# Patient Record
Sex: Female | Born: 1955 | Race: White | Hispanic: No | Marital: Married | Smoking: Never smoker
Health system: Southern US, Community
[De-identification: ages and names within clinical notes are randomized; demographics above are authoritative.]

## PROBLEM LIST (undated history)

## (undated) DIAGNOSIS — C801 Malignant (primary) neoplasm, unspecified: Secondary | ICD-10-CM

## (undated) DIAGNOSIS — M5416 Radiculopathy, lumbar region: Secondary | ICD-10-CM

## (undated) DIAGNOSIS — M25519 Pain in unspecified shoulder: Secondary | ICD-10-CM

## (undated) DIAGNOSIS — M549 Dorsalgia, unspecified: Secondary | ICD-10-CM

## (undated) HISTORY — DX: Pain in unspecified shoulder: M25.519

## (undated) HISTORY — PX: ROTATOR CUFF REPAIR: SHX139

## (undated) HISTORY — DX: Dorsalgia, unspecified: M54.9

## (undated) HISTORY — PX: DILATION AND CURETTAGE OF UTERUS: SHX78

## (undated) HISTORY — PX: TONSILLECTOMY AND ADENOIDECTOMY: SHX28

---

## 2019-01-21 ENCOUNTER — Other Ambulatory Visit: Payer: Self-pay | Admitting: *Deleted

## 2019-02-09 ENCOUNTER — Encounter: Payer: Self-pay | Admitting: Vascular Surgery

## 2019-03-14 ENCOUNTER — Telehealth (HOSPITAL_COMMUNITY): Payer: Self-pay | Admitting: Rehabilitation

## 2019-03-14 NOTE — Telephone Encounter (Signed)
The above patient or their representative was contacted and gave the following answers to these questions:         Do you have any of the following symptoms? No  Fever                    Cough                   Shortness of breath  Do  you have any of the following other symptoms? No   muscle pain         vomiting,        diarrhea        rash         weakness        red eye        abdominal pain         bruising          bruising or bleeding              joint pain           severe headache    Have you been in contact with someone who was or has been sick in the past 2 weeks? No  Yes                 Unsure                         Unable to assess   Does the person that you were in contact with have any of the following symptoms?   Cough         shortness of breath           muscle pain         vomiting,            diarrhea            rash            weakness           fever            red eye           abdominal pain           bruising  or  bleeding                joint pain                severe headache               Have you  or someone you have been in contact with traveled internationally in th last month? No        If yes, which countries?   Have you  or someone you have been in contact with traveled outside [place] in th last month? No         If yes, which state and city?   COMMENTS OR ACTION PLAN FOR THIS PATIENT:          

## 2019-03-15 ENCOUNTER — Ambulatory Visit (INDEPENDENT_AMBULATORY_CARE_PROVIDER_SITE_OTHER): Payer: BC Managed Care – PPO | Admitting: Vascular Surgery

## 2019-03-15 ENCOUNTER — Other Ambulatory Visit: Payer: Self-pay

## 2019-03-15 ENCOUNTER — Encounter: Payer: Self-pay | Admitting: Vascular Surgery

## 2019-03-15 VITALS — BP 138/91 | HR 68 | Temp 97.9°F | Resp 18 | Ht 62.0 in | Wt 135.0 lb

## 2019-03-15 DIAGNOSIS — M5137 Other intervertebral disc degeneration, lumbosacral region: Secondary | ICD-10-CM

## 2019-03-15 NOTE — Progress Notes (Signed)
Vascular and Vein Specialist of [facility]  Patient name: Jill Cameron MRN: [id number] DOB: 09-23-1956 Sex: female  REASON FOR CONSULT: Discuss anterior exposure for L5-S1 lumbar fusion with Dr. Lynann Bologna  HPI: Jill Cameron is a 63 y.o. female, who is here today for discussion of anterior exposure.  She has a progressive history of low back pain and also now with weakness in her right leg.  This has been progressive.  She is failed conservative treatment and saw Dr. Lynann Bologna for further evaluation.  He is recommended L5-S1 interbody fusion from anterior and posterior approach.  She is here today for discussion and my role for exposure.  She has never had any intra-abdominal surgery.  Is no history of cardiac disease and no history of peripheral vascular occlusive disease.  Past Medical History:  Diagnosis Date  . Back pain    chronic  . Shoulder pain    right    History reviewed. No pertinent family history.  SOCIAL HISTORY: Social History   Socioeconomic History  . Marital status: Unknown    Spouse name: Not on file  . Number of children: Not on file  . Years of education: Not on file  . Highest education level: Not on file  Occupational History  . Not on file  Social Needs  . Financial resource strain: Not on file  . Food insecurity:    Worry: Not on file    Inability: Not on file  . Transportation needs:    Medical: Not on file    Non-medical: Not on file  Tobacco Use  . Smoking status: Never Smoker  . Smokeless tobacco: Never Used  Substance and Sexual Activity  . Alcohol use: Yes    Alcohol/week: 1.0 standard drinks    Types: 1 Glasses of wine per week  . Drug use: Never  . Sexual activity: Not on file  Lifestyle  . Physical activity:    Days per week: Not on file    Minutes per session: Not on file  . Stress: Not on file  Relationships  . Social connections:    Talks on phone: Not on file    Gets together: Not on file   Attends religious service: Not on file    Active member of club or organization: Not on file    Attends meetings of clubs or organizations: Not on file    Relationship status: Not on file  . Intimate partner violence:    Fear of current or ex partner: Not on file    Emotionally abused: Not on file    Physically abused: Not on file    Forced sexual activity: Not on file  Other Topics Concern  . Not on file  Social History Narrative  . Not on file    Allergies  Allergen Reactions  . Neosporin [Neomycin-Bacitracin Zn-Polymyx]     Current Outpatient Medications  Medication Sig Dispense Refill  . diclofenac (VOLTAREN) 75 MG EC tablet     . estradiol (ESTRACE) 0.1 MG/GM vaginal cream I 1 GRAM VAGINALLY 3 TIMES A WK HS    . gabapentin (NEURONTIN) 300 MG capsule TK 1 C PO TID PRF PAIN    . Multiple Vitamin (MULTIVITAMIN) tablet Take 1 tablet by mouth daily.    Marland Kitchen PARoxetine Mesylate (BRISDELLE) 7.5 MG CAPS Take by mouth.     No current facility-administered medications for this visit.     REVIEW OF SYSTEMS:  [X]  denotes positive finding, [ ]  denotes negative finding Cardiac  Comments:  Chest pain or chest pressure:    Shortness of breath upon exertion:    Short of breath when lying flat:    Irregular heart rhythm:        Vascular    Pain in calf, thigh, or hip brought on by ambulation: x  neurogenic  Pain in feet at night that wakes you up from your sleep:  x  neurogenic  Blood clot in your veins:    Leg swelling:         Pulmonary    Oxygen at home:    Productive cough:     Wheezing:         Neurologic    Sudden weakness in arms or legs:     Sudden numbness in arms or legs:     Sudden onset of difficulty speaking or slurred speech:    Temporary loss of vision in one eye:     Problems with dizziness:         Gastrointestinal    Blood in stool:     Vomited blood:         Genitourinary    Burning when urinating:     Blood in urine:        Psychiatric    Major  depression:         Hematologic    Bleeding problems:    Problems with blood clotting too easily:        Skin    Rashes or ulcers:        Constitutional    Fever or chills:      PHYSICAL EXAM: Vitals:   03/15/19 1123  BP: (!) 138/91  Pulse: 68  Resp: 18  Temp: 97.9 F (36.6 C)  SpO2: 98%  Weight: 135 lb (61.2 kg)  Height: 5\' 2"  (1.575 m)    GENERAL: The patient is a well-nourished female, in no acute distress. The vital signs are documented above. CARDIOVASCULAR: Carotid arteries without bruits bilaterally.  2+ radial, 2+ femoral and 2+ dorsalis pedis pulses bilaterally PULMONARY: There is good air exchange  ABDOMEN: Soft and non-tender  MUSCULOSKELETAL: There are no major deformities or cyanosis. NEUROLOGIC: No focal weakness or paresthesias are detected. SKIN: There are no ulcers or rashes noted. PSYCHIATRIC: The patient has a normal affect.  DATA:  None  MEDICAL ISSUES: Had a long discussion with the patient regarding anterior exposure.  I explained mobilization of the rectus muscle, intraperitoneal contents in the retroperitoneal space, left ureter and arterial and venous structures overlying the spine.  Discussed the potential injury to all of these in particular discussed the potential injury for major venous structures.  Any contraindication for anterior exposure.  Will be available to proceed with surgery.  We did discuss the somewhat uncertainty regarding elective posting due to the [place] pandemic.  We will coordinate surgery through Dr. Laurena Bering office   Rosetta Posner, MD [facility] Vascular and Vein Specialists of [facility] Tel [phone] Pager [phone]

## 2019-04-05 ENCOUNTER — Other Ambulatory Visit: Payer: Self-pay | Admitting: *Deleted

## 2019-04-22 ENCOUNTER — Other Ambulatory Visit: Payer: Self-pay | Admitting: *Deleted

## 2019-06-07 ENCOUNTER — Telehealth: Payer: Self-pay | Admitting: *Deleted

## 2019-06-07 NOTE — Telephone Encounter (Signed)
[facility] surgery scheduler at Dr. Laurena Bering office stated patient aware and agreeable  to surgery with Dr. Scot Dock abdominal exposure for ALIF on 07/18/2019.

## 2019-06-09 ENCOUNTER — Encounter: Payer: Self-pay | Admitting: *Deleted

## 2019-06-09 ENCOUNTER — Other Ambulatory Visit: Payer: Self-pay | Admitting: *Deleted

## 2019-06-09 NOTE — Progress Notes (Signed)
Previously deferred due to Covid-19 Alif case with Dr. Lynann Bologna discussed with Dr. Scot Dock. Level L5-S1 scheduled for 07/18/2019. Patient seen in office by Dr. Donnetta Hutching.Abdominal exposure to be done by Dr. Scot Dock.

## 2019-06-20 ENCOUNTER — Other Ambulatory Visit: Payer: Self-pay | Admitting: Orthopedic Surgery

## 2019-06-22 ENCOUNTER — Other Ambulatory Visit: Payer: Self-pay | Admitting: *Deleted

## 2019-06-30 ENCOUNTER — Other Ambulatory Visit: Payer: Self-pay | Admitting: *Deleted

## 2019-07-13 NOTE — Progress Notes (Signed)
[facility] [phone] - [place], [place] [id number] [id number] Phone: [phone] Fax: [phone]    Your procedure is scheduled on Monday, August 31st.  Report to [facility] Main Entrance "A" at 5:30 A.M., and check in at the Admitting office.  Call this number if you have problems the morning of surgery:  [phone]  Call [phone] if you have any questions prior to your surgery date Monday-Friday 8am-4pm   Remember:  Do not eat after midnight the night before your surgery  You may drink clear liquids until 4:30 the morning of your surgery.   Clear liquids allowed are: Water, Non-Citrus Juices (without pulp), Carbonated Beverages, Clear Tea, Black Coffee Only, and Gatorade  Please complete your PRE-SURGERY ENSURE that was provided to you by 4:30 the morning of surgery.  Please, if able, drink it in one setting. DO NOT SIP.   Take these medicines the morning of surgery with A SIP OF WATER  gabapentin (NEURONTIN)  As of today, STOP taking any Aspirin (unless otherwise instructed by your surgeon), diclofenac (VOLTAREN), Aleve, Naproxen, Ibuprofen, Motrin, Advil, Goody's, BC's, all herbal medications, fish oil, and all vitamins.   The Morning of Surgery  Do not wear jewelry, make-up or nail polish.  Do not wear lotions, powders, or perfumes/colognes, or deodorant  Do not shave 48 hours prior to surgery.   Do not bring valuables to the hospital.  [facility] is not responsible for any belongings or valuables.  If you are a smoker, DO NOT Smoke 24 hours prior to surgery IF you wear a CPAP at night please bring your mask, tubing, and machine the morning of surgery   Remember that you must have someone to transport you home after your surgery, and remain with you for 24 hours if you are discharged the same day.  Contacts, glasses, hearing aids, dentures or bridgework may not be worn into  surgery.   Leave your suitcase in the car.  After surgery it may be brought to your room.  For patients admitted to the hospital, discharge time will be determined by your treatment team.  Patients discharged the day of surgery will not be allowed to drive home.    Special instructions:   [facility]- Preparing For Surgery  Before surgery, you can play an important role. Because skin is not sterile, your skin needs to be as free of germs as possible. You can reduce the number of germs on your skin by washing with CHG (chlorahexidine gluconate) Soap before surgery.  CHG is an antiseptic cleaner which kills germs and bonds with the skin to continue killing germs even after washing.    Oral Hygiene is also important to reduce your risk of infection.  Remember - BRUSH YOUR TEETH THE MORNING OF SURGERY WITH YOUR REGULAR TOOTHPASTE  Please do not use if you have an allergy to CHG or antibacterial soaps. If your skin becomes reddened/irritated stop using the CHG.  Do not shave (including legs and underarms) for at least 48 hours prior to first CHG shower. It is OK to shave your face.  Please follow these instructions carefully.   1. Shower the NIGHT BEFORE SURGERY and the MORNING OF SURGERY with CHG Soap.   2. If you chose to wash your hair, wash your hair first as usual with your normal shampoo.  3. After you shampoo, rinse your hair and body thoroughly to remove  the shampoo.  4. Use CHG as you would any other liquid soap. You can apply CHG directly to the skin and wash gently with a scrungie or a clean washcloth.   5. Apply the CHG Soap to your body ONLY FROM THE NECK DOWN.  Do not use on open wounds or open sores. Avoid contact with your eyes, ears, mouth and genitals (private parts). Wash Face and genitals (private parts)  with your normal soap.   6. Wash thoroughly, paying special attention to the area where your surgery will be performed.  7. Thoroughly rinse your body with warm water  from the neck down.  8. DO NOT shower/wash with your normal soap after using and rinsing off the CHG Soap.  9. Pat yourself dry with a CLEAN TOWEL.  10. Wear CLEAN PAJAMAS to bed the night before surgery, wear comfortable clothes the morning of surgery  11. Place CLEAN SHEETS on your bed the night of your first shower and DO NOT SLEEP WITH PETS.  Day of Surgery: Do not apply any deodorants/lotions. Please shower the morning of surgery with the CHG soap  Please wear clean clothes to the hospital/surgery center.   Remember to brush your teeth WITH YOUR REGULAR TOOTHPASTE.  Please read over the following fact sheets that you were given.

## 2019-07-14 ENCOUNTER — Other Ambulatory Visit (HOSPITAL_COMMUNITY)
Admission: RE | Admit: 2019-07-14 | Discharge: 2019-07-14 | Disposition: A | Discharge status: Home / Self Care | Payer: BC Managed Care – PPO | Source: Ambulatory Visit | Attending: Orthopedic Surgery | Admitting: Orthopedic Surgery

## 2019-07-14 ENCOUNTER — Encounter (HOSPITAL_COMMUNITY)
Admission: RE | Admit: 2019-07-14 | Discharge: 2019-07-14 | Disposition: A | Discharge status: Home / Self Care | Payer: BC Managed Care – PPO | Source: Ambulatory Visit | Attending: Orthopedic Surgery | Admitting: Orthopedic Surgery

## 2019-07-14 ENCOUNTER — Encounter (HOSPITAL_COMMUNITY): Payer: Self-pay

## 2019-07-14 ENCOUNTER — Other Ambulatory Visit: Payer: Self-pay

## 2019-07-14 DIAGNOSIS — Z20828 Contact with and (suspected) exposure to other viral communicable diseases: Secondary | ICD-10-CM | POA: Insufficient documentation

## 2019-07-14 DIAGNOSIS — Z01812 Encounter for preprocedural laboratory examination: Secondary | ICD-10-CM | POA: Diagnosis not present

## 2019-07-14 HISTORY — DX: Malignant (primary) neoplasm, unspecified: C80.1

## 2019-07-14 LAB — PROTIME-INR
INR: 1 (ref 0.8–1.2)
Prothrombin Time: 12.6 seconds (ref 11.4–15.2)

## 2019-07-14 LAB — URINALYSIS, ROUTINE W REFLEX MICROSCOPIC
Bilirubin Urine: NEGATIVE
Glucose, UA: NEGATIVE mg/dL
Hgb urine dipstick: NEGATIVE
Ketones, ur: NEGATIVE mg/dL
Leukocytes,Ua: NEGATIVE
Nitrite: NEGATIVE
Protein, ur: NEGATIVE mg/dL
Specific Gravity, Urine: 1.003 — ABNORMAL LOW (ref 1.005–1.030)
pH: 7 (ref 5.0–8.0)

## 2019-07-14 LAB — CBC WITH DIFFERENTIAL/PLATELET
Abs Immature Granulocytes: 0.01 10*3/uL (ref 0.00–0.07)
Basophils Absolute: 0.1 10*3/uL (ref 0.0–0.1)
Basophils Relative: 1 %
Eosinophils Absolute: 0.3 10*3/uL (ref 0.0–0.5)
Eosinophils Relative: 5 %
HCT: 45.8 % (ref 36.0–46.0)
Hemoglobin: 14.9 g/dL (ref 12.0–15.0)
Immature Granulocytes: 0 %
Lymphocytes Relative: 23 %
Lymphs Abs: 1.3 10*3/uL (ref 0.7–4.0)
MCH: 31 pg (ref 26.0–34.0)
MCHC: 32.5 g/dL (ref 30.0–36.0)
MCV: 95.2 fL (ref 80.0–100.0)
Monocytes Absolute: 0.5 10*3/uL (ref 0.1–1.0)
Monocytes Relative: 9 %
Neutro Abs: 3.6 10*3/uL (ref 1.7–7.7)
Neutrophils Relative %: 62 %
Platelets: 351 10*3/uL (ref 150–400)
RBC: 4.81 MIL/uL (ref 3.87–5.11)
RDW: 12.5 % (ref 11.5–15.5)
WBC: 5.8 10*3/uL (ref 4.0–10.5)
nRBC: 0 % (ref 0.0–0.2)

## 2019-07-14 LAB — TYPE AND SCREEN
ABO/RH(D): A POS
Antibody Screen: NEGATIVE

## 2019-07-14 LAB — COMPREHENSIVE METABOLIC PANEL
ALT: 32 U/L (ref 0–44)
AST: 29 U/L (ref 15–41)
Albumin: 4.2 g/dL (ref 3.5–5.0)
Alkaline Phosphatase: 93 U/L (ref 38–126)
Anion gap: 12 (ref 5–15)
BUN: 10 mg/dL (ref 8–23)
CO2: 27 mmol/L (ref 22–32)
Calcium: 10.1 mg/dL (ref 8.9–10.3)
Chloride: 103 mmol/L (ref 98–111)
Creatinine, Ser: 0.72 mg/dL (ref 0.44–1.00)
GFR calc Af Amer: >60 mL/min (ref >60)
GFR calc non Af Amer: >60 mL/min (ref >60)
Glucose, Bld: 98 mg/dL (ref 70–99)
Potassium: 3.7 mmol/L (ref 3.5–5.1)
Sodium: 142 mmol/L (ref 135–145)
Total Bilirubin: 1 mg/dL (ref 0.3–1.2)
Total Protein: 7.2 g/dL (ref 6.5–8.1)

## 2019-07-14 LAB — SURGICAL PCR SCREEN
MRSA, PCR: NEGATIVE
Staphylococcus aureus: POSITIVE — AB

## 2019-07-14 LAB — SARS CORONAVIRUS 2 (TAT 6-24 HRS): SARS Coronavirus 2: NEGATIVE

## 2019-07-14 LAB — APTT: aPTT: 29 seconds (ref 24–36)

## 2019-07-14 LAB — ABO/RH: ABO/RH(D): A POS

## 2019-07-14 NOTE — Progress Notes (Signed)
PCP - none,but sees Orson Slick PA @ [place] in [place] Cardiologist - na  Chest x-ray - na EKG - na Stress Test - na ECHO - na Cardiac Cath - na  Sleep Study - na CPAP - na  Fasting Blood Sugar - na Checks Blood Sugar _____ times a day  Blood Thinner Instructions: Aspirin Instructions:  Anesthesia review:   Patient denies shortness of breath, fever, cough and chest pain at PAT appointment   Patient verbalized understanding of instructions that were given to them at the PAT appointment. Patient was also instructed that they will need to review over the PAT instructions again at home before surgery.  Pt. Reported he slipped and fell on her deck on 07/01/19 and hit her upper back,and is bruised. Stated she called Dr. Lynann Bologna office to let them know on 07/11/19. They did not suggest any other follow up for pt.

## 2019-07-17 NOTE — Anesthesia Preprocedure Evaluation (Addendum)
Anesthesia Evaluation  Patient identified by MRN, date of birth, ID band Patient awake    Reviewed: Allergy & Precautions, NPO status , Patient's Chart, lab work & pertinent test results  Airway Mallampati: II  TM Distance: >3 FB Neck ROM: Full    Dental no notable dental hx.    Pulmonary neg pulmonary ROS,    Pulmonary exam normal breath sounds clear to auscultation       Cardiovascular negative cardio ROS Normal cardiovascular exam Rhythm:Regular Rate:Normal     Neuro/Psych negative neurological ROS  negative psych ROS   GI/Hepatic negative GI ROS, Neg liver ROS,   Endo/Other  negative endocrine ROS  Renal/GU negative Renal ROS     Musculoskeletal negative musculoskeletal ROS (+)   Abdominal   Peds  Hematology negative hematology ROS (+)   Anesthesia Other Findings   Reproductive/Obstetrics negative OB ROS                            Anesthesia Physical Anesthesia Plan  ASA: II  Anesthesia Plan: General   Post-op Pain Management:    Induction: Intravenous  PONV Risk Score and Plan: 4 or greater and Ondansetron, Dexamethasone, Midazolam and Treatment may vary due to age or medical condition  Airway Management Planned: Oral ETT  Additional Equipment: Arterial line  Intra-op Plan:   Post-operative Plan: Extubation in OR  Informed Consent: I have reviewed the patients History and Physical, chart, labs and discussed the procedure including the risks, benefits and alternatives for the proposed anesthesia with the patient or authorized representative who has indicated his/her understanding and acceptance.     Dental advisory given  Plan Discussed with: CRNA  Anesthesia Plan Comments: (2 x PIV, sufenta infusion)        Anesthesia Quick Evaluation

## 2019-07-18 ENCOUNTER — Encounter (HOSPITAL_COMMUNITY)
Admission: RE | Disposition: A | Discharge status: Home / Self Care | Payer: Self-pay | Source: Home / Self Care | Attending: Orthopedic Surgery

## 2019-07-18 ENCOUNTER — Other Ambulatory Visit: Payer: Self-pay

## 2019-07-18 ENCOUNTER — Inpatient Hospital Stay (HOSPITAL_COMMUNITY): Payer: BC Managed Care – PPO | Admitting: Certified Registered"

## 2019-07-18 ENCOUNTER — Encounter (HOSPITAL_COMMUNITY): Payer: Self-pay

## 2019-07-18 ENCOUNTER — Inpatient Hospital Stay (HOSPITAL_COMMUNITY)
Admission: RE | Admit: 2019-07-18 | Discharge: 2019-07-19 | DRG: 455 | Disposition: A | Discharge status: Home / Self Care | Payer: BC Managed Care – PPO | Attending: Orthopedic Surgery | Admitting: Orthopedic Surgery

## 2019-07-18 ENCOUNTER — Inpatient Hospital Stay (HOSPITAL_COMMUNITY): Payer: BC Managed Care – PPO

## 2019-07-18 DIAGNOSIS — M5416 Radiculopathy, lumbar region: Secondary | ICD-10-CM | POA: Diagnosis not present

## 2019-07-18 DIAGNOSIS — Z79899 Other long term (current) drug therapy: Secondary | ICD-10-CM | POA: Diagnosis not present

## 2019-07-18 DIAGNOSIS — Z419 Encounter for procedure for purposes other than remedying health state, unspecified: Secondary | ICD-10-CM

## 2019-07-18 DIAGNOSIS — Z791 Long term (current) use of non-steroidal anti-inflammatories (NSAID): Secondary | ICD-10-CM

## 2019-07-18 DIAGNOSIS — G8929 Other chronic pain: Secondary | ICD-10-CM | POA: Diagnosis present

## 2019-07-18 DIAGNOSIS — M5117 Intervertebral disc disorders with radiculopathy, lumbosacral region: Principal | ICD-10-CM | POA: Diagnosis present

## 2019-07-18 DIAGNOSIS — Z85820 Personal history of malignant melanoma of skin: Secondary | ICD-10-CM | POA: Diagnosis not present

## 2019-07-18 DIAGNOSIS — M48061 Spinal stenosis, lumbar region without neurogenic claudication: Secondary | ICD-10-CM | POA: Diagnosis present

## 2019-07-18 DIAGNOSIS — M5410 Radiculopathy, site unspecified: Secondary | ICD-10-CM | POA: Diagnosis present

## 2019-07-18 DIAGNOSIS — M62838 Other muscle spasm: Secondary | ICD-10-CM | POA: Diagnosis present

## 2019-07-18 DIAGNOSIS — M545 Low back pain: Secondary | ICD-10-CM | POA: Diagnosis present

## 2019-07-18 HISTORY — DX: Radiculopathy, lumbar region: M54.16

## 2019-07-18 HISTORY — PX: ANTERIOR LUMBAR FUSION: SHX1170

## 2019-07-18 HISTORY — PX: ABDOMINAL EXPOSURE: SHX5708

## 2019-07-18 SURGERY — ANTERIOR LUMBAR FUSION 2 LEVELS
Anesthesia: General

## 2019-07-18 MED ORDER — SODIUM CHLORIDE 0.9% FLUSH: 3.0000 mL | INTRAVENOUS | Status: DC | PRN

## 2019-07-18 MED ORDER — LIDOCAINE 2% (20 MG/ML) 5 ML SYRINGE
INTRAMUSCULAR | Status: DC | PRN
  Administered 2019-07-18: 100 mg via INTRAVENOUS

## 2019-07-18 MED ORDER — PROPOFOL 10 MG/ML IV BOLUS
INTRAVENOUS | Status: AC
  Filled 2019-07-18: qty 20

## 2019-07-18 MED ORDER — ACETAMINOPHEN 325 MG PO TABS: 650.0000 mg | ORAL_TABLET | ORAL | Status: DC | PRN

## 2019-07-18 MED ORDER — ACETAMINOPHEN 650 MG RE SUPP: 650.0000 mg | RECTAL | Status: DC | PRN

## 2019-07-18 MED ORDER — PROMETHAZINE HCL 25 MG/ML IJ SOLN: 6.2500 mg | INTRAMUSCULAR | Status: DC | PRN

## 2019-07-18 MED ORDER — ROCURONIUM BROMIDE 50 MG/5ML IV SOSY
PREFILLED_SYRINGE | INTRAVENOUS | Status: DC | PRN
  Administered 2019-07-18: 50 mg via INTRAVENOUS
  Administered 2019-07-18: 20 mg via INTRAVENOUS
  Administered 2019-07-18: 50 mg via INTRAVENOUS

## 2019-07-18 MED ORDER — MIDAZOLAM HCL 5 MG/5ML IJ SOLN
INTRAMUSCULAR | Status: DC | PRN
  Administered 2019-07-18: 2 mg via INTRAVENOUS

## 2019-07-18 MED ORDER — CALCIUM CARBONATE 1250 (500 CA) MG PO TABS
1.0000 | ORAL_TABLET | Freq: Every day | ORAL | Status: DC
  Administered 2019-07-19: 500 mg via ORAL
  Filled 2019-07-18: qty 1

## 2019-07-18 MED ORDER — ALBUMIN HUMAN 5 % IV SOLN
INTRAVENOUS | Status: DC | PRN
  Administered 2019-07-18: 10:00:00 via INTRAVENOUS

## 2019-07-18 MED ORDER — MORPHINE SULFATE (PF) 2 MG/ML IV SOLN: 1.0000 mg | INTRAVENOUS | Status: DC | PRN

## 2019-07-18 MED ORDER — CEFAZOLIN SODIUM 1 G IJ SOLR
INTRAMUSCULAR | Status: AC
  Filled 2019-07-18: qty 10

## 2019-07-18 MED ORDER — ZOLPIDEM TARTRATE 5 MG PO TABS: 5.0000 mg | ORAL_TABLET | Freq: Every evening | ORAL | Status: DC | PRN

## 2019-07-18 MED ORDER — SUGAMMADEX SODIUM 200 MG/2ML IV SOLN
INTRAVENOUS | Status: DC | PRN
  Administered 2019-07-18 (×2): 100 mg via INTRAVENOUS

## 2019-07-18 MED ORDER — PROPOFOL 10 MG/ML IV BOLUS
INTRAVENOUS | Status: DC | PRN
  Administered 2019-07-18: 140 mg via INTRAVENOUS
  Administered 2019-07-18: 40 mg via INTRAVENOUS

## 2019-07-18 MED ORDER — DOCUSATE SODIUM 100 MG PO CAPS
100.0000 mg | ORAL_CAPSULE | Freq: Two times a day (BID) | ORAL | Status: DC
  Administered 2019-07-18 – 2019-07-19 (×2): 100 mg via ORAL
  Filled 2019-07-18: qty 1

## 2019-07-18 MED ORDER — DIAZEPAM 5 MG PO TABS
5.0000 mg | ORAL_TABLET | Freq: Four times a day (QID) | ORAL | Status: DC | PRN
  Administered 2019-07-18: 5 mg via ORAL

## 2019-07-18 MED ORDER — SODIUM CHLORIDE 0.9% FLUSH
3.0000 mL | Freq: Two times a day (BID) | INTRAVENOUS | Status: DC
  Administered 2019-07-18: 3 mL via INTRAVENOUS

## 2019-07-18 MED ORDER — BUPIVACAINE LIPOSOME 1.3 % IJ SUSP
20.0000 mL | Freq: Once | INTRAMUSCULAR | Status: DC
  Filled 2019-07-18: qty 20

## 2019-07-18 MED ORDER — BUPIVACAINE-EPINEPHRINE (PF) 0.25% -1:200000 IJ SOLN
INTRAMUSCULAR | Status: AC
  Filled 2019-07-18: qty 30

## 2019-07-18 MED ORDER — CALCIUM CARBONATE 1500 (600 CA) MG PO TABS: 600.0000 mg | ORAL_TABLET | Freq: Every day | ORAL | Status: DC

## 2019-07-18 MED ORDER — MENTHOL 3 MG MT LOZG: 1.0000 | LOZENGE | OROMUCOSAL | Status: DC | PRN

## 2019-07-18 MED ORDER — HYDROMORPHONE HCL 1 MG/ML IJ SOLN
0.2500 mg | INTRAMUSCULAR | Status: DC | PRN
  Administered 2019-07-18: 0.25 mg via INTRAVENOUS
  Administered 2019-07-18: 0.5 mg via INTRAVENOUS
  Administered 2019-07-18: 13:00:00 0.25 mg via INTRAVENOUS

## 2019-07-18 MED ORDER — BISACODYL 5 MG PO TBEC
5.0000 mg | DELAYED_RELEASE_TABLET | Freq: Every day | ORAL | Status: DC | PRN
  Administered 2019-07-18: 5 mg via ORAL
  Filled 2019-07-18: qty 1

## 2019-07-18 MED ORDER — 0.9 % SODIUM CHLORIDE (POUR BTL) OPTIME
TOPICAL | Status: DC | PRN
  Administered 2019-07-18 (×2): 1000 mL

## 2019-07-18 MED ORDER — ALUM & MAG HYDROXIDE-SIMETH 200-200-20 MG/5ML PO SUSP: 30.0000 mL | Freq: Four times a day (QID) | ORAL | Status: DC | PRN

## 2019-07-18 MED ORDER — PROPOFOL 500 MG/50ML IV EMUL
INTRAVENOUS | Status: DC | PRN
  Administered 2019-07-18: 50 ug/kg/min via INTRAVENOUS
  Administered 2019-07-18: 65 ug/kg/min via INTRAVENOUS

## 2019-07-18 MED ORDER — SUCCINYLCHOLINE CHLORIDE 200 MG/10ML IV SOSY
PREFILLED_SYRINGE | INTRAVENOUS | Status: AC
  Filled 2019-07-18: qty 10

## 2019-07-18 MED ORDER — TRANEXAMIC ACID 1000 MG/10ML IV SOLN
2000.0000 mg | Freq: Once | INTRAVENOUS | Status: DC
  Filled 2019-07-18: qty 20

## 2019-07-18 MED ORDER — CEFAZOLIN SODIUM-DEXTROSE 2-4 GM/100ML-% IV SOLN
2.0000 g | Freq: Three times a day (TID) | INTRAVENOUS | Status: AC
  Administered 2019-07-18 – 2019-07-19 (×2): 2 g via INTRAVENOUS
  Filled 2019-07-18 (×2): qty 100

## 2019-07-18 MED ORDER — ONDANSETRON HCL 4 MG/2ML IJ SOLN
INTRAMUSCULAR | Status: AC
  Filled 2019-07-18: qty 4

## 2019-07-18 MED ORDER — ESTRADIOL 0.1 MG/GM VA CREA: 1.0000 | TOPICAL_CREAM | VAGINAL | Status: DC

## 2019-07-18 MED ORDER — ONDANSETRON HCL 4 MG PO TABS: 4.0000 mg | ORAL_TABLET | Freq: Four times a day (QID) | ORAL | Status: DC | PRN

## 2019-07-18 MED ORDER — SENNOSIDES-DOCUSATE SODIUM 8.6-50 MG PO TABS: 1.0000 | ORAL_TABLET | Freq: Every evening | ORAL | Status: DC | PRN

## 2019-07-18 MED ORDER — ONDANSETRON HCL 4 MG/2ML IJ SOLN: 4.0000 mg | Freq: Four times a day (QID) | INTRAMUSCULAR | Status: DC | PRN

## 2019-07-18 MED ORDER — POVIDONE-IODINE 7.5 % EX SOLN
Freq: Once | CUTANEOUS | Status: DC
  Filled 2019-07-18: qty 118

## 2019-07-18 MED ORDER — SUCCINYLCHOLINE CHLORIDE 20 MG/ML IJ SOLN
INTRAMUSCULAR | Status: DC | PRN
  Administered 2019-07-18: 100 mg via INTRAVENOUS

## 2019-07-18 MED ORDER — ONDANSETRON HCL 4 MG/2ML IJ SOLN
INTRAMUSCULAR | Status: DC | PRN
  Administered 2019-07-18 (×2): 4 mg via INTRAVENOUS

## 2019-07-18 MED ORDER — LIDOCAINE 2% (20 MG/ML) 5 ML SYRINGE
INTRAMUSCULAR | Status: AC
  Filled 2019-07-18: qty 5

## 2019-07-18 MED ORDER — LACTATED RINGERS IV SOLN
INTRAVENOUS | Status: DC | PRN
  Administered 2019-07-18: 07:00:00 via INTRAVENOUS

## 2019-07-18 MED ORDER — OXYCODONE-ACETAMINOPHEN 5-325 MG PO TABS
1.0000 | ORAL_TABLET | ORAL | Status: DC | PRN
  Administered 2019-07-18: 2 via ORAL
  Administered 2019-07-18: 1 via ORAL
  Administered 2019-07-18 – 2019-07-19 (×2): 2 via ORAL
  Administered 2019-07-19: 1 via ORAL
  Filled 2019-07-18 (×4): qty 2
  Filled 2019-07-18: qty 1

## 2019-07-18 MED ORDER — FLEET ENEMA 7-19 GM/118ML RE ENEM: 1.0000 | ENEMA | Freq: Once | RECTAL | Status: DC | PRN

## 2019-07-18 MED ORDER — THROMBIN 20000 UNITS EX SOLR
CUTANEOUS | Status: AC
  Filled 2019-07-18: qty 20000

## 2019-07-18 MED ORDER — THROMBIN 20000 UNITS EX SOLR
CUTANEOUS | Status: DC | PRN
  Administered 2019-07-18: 20000 [IU] via TOPICAL

## 2019-07-18 MED ORDER — MEPERIDINE HCL 25 MG/ML IJ SOLN: 6.2500 mg | INTRAMUSCULAR | Status: DC | PRN

## 2019-07-18 MED ORDER — FENTANYL CITRATE (PF) 100 MCG/2ML IJ SOLN
INTRAMUSCULAR | Status: DC | PRN
  Administered 2019-07-18: 50 ug via INTRAVENOUS
  Administered 2019-07-18: 100 ug via INTRAVENOUS
  Administered 2019-07-18 (×3): 50 ug via INTRAVENOUS

## 2019-07-18 MED ORDER — DEXAMETHASONE SODIUM PHOSPHATE 10 MG/ML IJ SOLN
INTRAMUSCULAR | Status: DC | PRN
  Administered 2019-07-18: 10 mg via INTRAVENOUS

## 2019-07-18 MED ORDER — POTASSIUM CHLORIDE IN NACL 20-0.9 MEQ/L-% IV SOLN: INTRAVENOUS | Status: DC

## 2019-07-18 MED ORDER — HYDROMORPHONE HCL 1 MG/ML IJ SOLN
INTRAMUSCULAR | Status: AC
  Administered 2019-07-18: 0.25 mg via INTRAVENOUS
  Filled 2019-07-18: qty 1

## 2019-07-18 MED ORDER — ROCURONIUM BROMIDE 10 MG/ML (PF) SYRINGE
PREFILLED_SYRINGE | INTRAVENOUS | Status: AC
  Filled 2019-07-18: qty 10

## 2019-07-18 MED ORDER — PAROXETINE HCL 10 MG PO TABS
10.0000 mg | ORAL_TABLET | Freq: Every day | ORAL | Status: DC
  Administered 2019-07-18: 10 mg via ORAL
  Filled 2019-07-18 (×2): qty 1

## 2019-07-18 MED ORDER — MIDAZOLAM HCL 2 MG/2ML IJ SOLN
INTRAMUSCULAR | Status: AC
  Filled 2019-07-18: qty 2

## 2019-07-18 MED ORDER — BUPIVACAINE LIPOSOME 1.3 % IJ SUSP
INTRAMUSCULAR | Status: DC | PRN
  Administered 2019-07-18: 20 mL

## 2019-07-18 MED ORDER — PAROXETINE MESYLATE 7.5 MG PO CAPS: 7.5000 mg | ORAL_CAPSULE | Freq: Every day | ORAL | Status: DC

## 2019-07-18 MED ORDER — PHENOL 1.4 % MT LIQD: 1.0000 | OROMUCOSAL | Status: DC | PRN

## 2019-07-18 MED ORDER — SODIUM CHLORIDE 0.9 % IV SOLN
INTRAVENOUS | Status: DC | PRN
  Administered 2019-07-18: 20 ug/min via INTRAVENOUS

## 2019-07-18 MED ORDER — METHYLENE BLUE 0.5 % INJ SOLN
INTRAVENOUS | Status: AC
  Filled 2019-07-18: qty 10

## 2019-07-18 MED ORDER — GABAPENTIN 300 MG PO CAPS
300.0000 mg | ORAL_CAPSULE | Freq: Three times a day (TID) | ORAL | Status: DC
  Administered 2019-07-18 – 2019-07-19 (×3): 300 mg via ORAL
  Filled 2019-07-18 (×3): qty 1

## 2019-07-18 MED ORDER — CHLORHEXIDINE GLUCONATE 4 % EX LIQD: 60.0000 mL | Freq: Once | CUTANEOUS | Status: DC

## 2019-07-18 MED ORDER — ARTIFICIAL TEARS OPHTHALMIC OINT
TOPICAL_OINTMENT | OPHTHALMIC | Status: DC | PRN
  Administered 2019-07-18: 1 via OPHTHALMIC

## 2019-07-18 MED ORDER — FENTANYL CITRATE (PF) 250 MCG/5ML IJ SOLN
INTRAMUSCULAR | Status: AC
  Filled 2019-07-18: qty 5

## 2019-07-18 MED ORDER — DEXAMETHASONE SODIUM PHOSPHATE 10 MG/ML IJ SOLN
INTRAMUSCULAR | Status: AC
  Filled 2019-07-18: qty 1

## 2019-07-18 MED ORDER — BUPIVACAINE-EPINEPHRINE 0.25% -1:200000 IJ SOLN
INTRAMUSCULAR | Status: DC | PRN
  Administered 2019-07-18: 10 mL
  Administered 2019-07-18: 25 mL
  Administered 2019-07-18: 10 mL

## 2019-07-18 MED ORDER — DIAZEPAM 5 MG PO TABS
ORAL_TABLET | ORAL | Status: AC
  Filled 2019-07-18: qty 1

## 2019-07-18 MED ORDER — PROPOFOL 1000 MG/100ML IV EMUL
INTRAVENOUS | Status: AC
  Filled 2019-07-18: qty 100

## 2019-07-18 MED ORDER — CEFAZOLIN SODIUM-DEXTROSE 2-4 GM/100ML-% IV SOLN
2.0000 g | INTRAVENOUS | Status: AC
  Administered 2019-07-18: 1 g via INTRAVENOUS
  Administered 2019-07-18: 2 g via INTRAVENOUS
  Filled 2019-07-18: qty 100

## 2019-07-18 SURGICAL SUPPLY — 133 items
APPLIER CLIP 11 MED OPEN (CLIP) ×2
BENZOIN TINCTURE PRP APPL 2/3 (GAUZE/BANDAGES/DRESSINGS) ×2 IMPLANT
BLADE CLIPPER SURG (BLADE) ×1 IMPLANT
BLADE SURG 10 STRL SS (BLADE) ×2 IMPLANT
BONE VIVIGEN FORMABLE 10CC (Bone Implant) ×2 IMPLANT
BUR PRESCISION 1.7 ELITE (BURR) IMPLANT
BUR ROUND PRECISION 4.0 (BURR) IMPLANT
BUR SABER RD CUTTING 3.0 (BURR) IMPLANT
CARTRIDGE OIL MAESTRO DRILL (MISCELLANEOUS) ×2 IMPLANT
CLIP APPLIE 11 MED OPEN (CLIP) ×2 IMPLANT
CLIP LIGATING EXTRA MED SLVR (CLIP) ×1 IMPLANT
CLIP LIGATING EXTRA SM BLUE (MISCELLANEOUS) ×1 IMPLANT
CONT SPEC 4OZ CLIKSEAL STRL BL (MISCELLANEOUS) ×2 IMPLANT
CORD BIPOLAR FORCEPS 12FT (ELECTRODE) ×1 IMPLANT
COVER SURGICAL LIGHT HANDLE (MISCELLANEOUS) ×1 IMPLANT
COVER WAND RF STERILE (DRAPES) ×4 IMPLANT
DERMABOND ADVANCED (GAUZE/BANDAGES/DRESSINGS) ×1
DERMABOND ADVANCED .7 DNX12 (GAUZE/BANDAGES/DRESSINGS) ×1 IMPLANT
DIFFUSER DRILL AIR PNEUMATIC (MISCELLANEOUS) ×3 IMPLANT
DRAIN CHANNEL 15F RND FF W/TCR (WOUND CARE) ×1 IMPLANT
DRAPE C-ARM 42X72 X-RAY (DRAPES) ×4 IMPLANT
DRAPE C-ARMOR (DRAPES) ×2 IMPLANT
DRAPE POUCH INSTRU U-SHP 10X18 (DRAPES) ×1 IMPLANT
DRAPE SURG 17X23 STRL (DRAPES) ×12 IMPLANT
DRSG MEPILEX BORDER 4X12 (GAUZE/BANDAGES/DRESSINGS) ×1 IMPLANT
DRSG MEPILEX BORDER 4X8 (GAUZE/BANDAGES/DRESSINGS) IMPLANT
DURAPREP 26ML APPLICATOR (WOUND CARE) ×3 IMPLANT
ELECT BLADE 4.0 EZ CLEAN MEGAD (MISCELLANEOUS) ×4
ELECT CAUTERY BLADE 6.4 (BLADE) ×4 IMPLANT
ELECT REM PT RETURN 9FT ADLT (ELECTROSURGICAL) ×4
ELECTRODE BLDE 4.0 EZ CLN MEGD (MISCELLANEOUS) ×2 IMPLANT
ELECTRODE REM PT RTRN 9FT ADLT (ELECTROSURGICAL) ×1 IMPLANT
EVACUATOR SILICONE 100CC (DRAIN) ×1 IMPLANT
FEE INTRAOP MONITOR IMPULS NCS (MISCELLANEOUS) IMPLANT
GAUZE 4X4 16PLY RFD (DISPOSABLE) ×3 IMPLANT
GAUZE SPONGE 4X4 12PLY STRL (GAUZE/BANDAGES/DRESSINGS) ×2 IMPLANT
GLOVE BIO SURGEON STRL SZ 6.5 (GLOVE) ×3 IMPLANT
GLOVE BIO SURGEON STRL SZ7 (GLOVE) ×3 IMPLANT
GLOVE BIO SURGEON STRL SZ7.5 (GLOVE) ×3 IMPLANT
GLOVE BIO SURGEON STRL SZ8 (GLOVE) ×3 IMPLANT
GLOVE BIOGEL PI IND STRL 7.0 (GLOVE) ×1 IMPLANT
GLOVE BIOGEL PI IND STRL 8 (GLOVE) ×3 IMPLANT
GLOVE BIOGEL PI INDICATOR 7.0 (GLOVE) ×2
GLOVE BIOGEL PI INDICATOR 8 (GLOVE) ×3
GLOVE SS BIOGEL STRL SZ 7.5 (GLOVE) ×1 IMPLANT
GLOVE SUPERSENSE BIOGEL SZ 7.5 (GLOVE) ×2
GLOVE SURG SS PI 7.0 STRL IVOR (GLOVE) ×1 IMPLANT
GOWN STRL REUS W/ TWL LRG LVL3 (GOWN DISPOSABLE) ×2 IMPLANT
GOWN STRL REUS W/ TWL XL LVL3 (GOWN DISPOSABLE) ×2 IMPLANT
GOWN STRL REUS W/TWL LRG LVL3 (GOWN DISPOSABLE) ×3
GOWN STRL REUS W/TWL XL LVL3 (GOWN DISPOSABLE) ×3
GRAFT BNE MATRIX VG FRMBL L 10 (Bone Implant) IMPLANT
GUIDEWIRE BLUNT VIPER II 1.45 (WIRE) ×1 IMPLANT
GUIDEWIRE SHARP VIPER II (WIRE) ×4 IMPLANT
HEMOSTAT SNOW SURGICEL 2X4 (HEMOSTASIS) IMPLANT
HEMOSTAT SURGICEL 2X14 (HEMOSTASIS) IMPLANT
INSERT FOGARTY 61MM (MISCELLANEOUS) IMPLANT
INSERT FOGARTY SM (MISCELLANEOUS) IMPLANT
INTRAOP MONITOR FEE IMPULS NCS (MISCELLANEOUS) ×1
INTRAOP MONITOR FEE IMPULSE (MISCELLANEOUS) ×1
IV CATH 14GX2 1/4 (CATHETERS) ×2 IMPLANT
KIT ALARA NEURO ACCESS (KITS) ×2 IMPLANT
KIT BASIN OR (CUSTOM PROCEDURE TRAY) ×3 IMPLANT
KIT POSITION SURG JACKSON T1 (MISCELLANEOUS) ×2 IMPLANT
KIT TURNOVER KIT B (KITS) ×3 IMPLANT
LOOP VESSEL MAXI BLUE (MISCELLANEOUS) IMPLANT
LOOP VESSEL MINI RED (MISCELLANEOUS) IMPLANT
MARKER SKIN DUAL TIP RULER LAB (MISCELLANEOUS) ×2 IMPLANT
NDL HYPO 25GX1X1/2 BEV (NEEDLE) ×1 IMPLANT
NDL SPNL 18GX3.5 QUINCKE PK (NEEDLE) ×2 IMPLANT
NEEDLE HYPO 25GX1X1/2 BEV (NEEDLE) ×2 IMPLANT
NEEDLE SPNL 18GX3.5 QUINCKE PK (NEEDLE) ×4 IMPLANT
NS IRRIG 1000ML POUR BTL (IV SOLUTION) ×3 IMPLANT
OIL CARTRIDGE MAESTRO DRILL (MISCELLANEOUS)
PACK LAMINECTOMY ORTHO (CUSTOM PROCEDURE TRAY) ×3 IMPLANT
PACK UNIVERSAL I (CUSTOM PROCEDURE TRAY) ×3 IMPLANT
PAD ARMBOARD 7.5X6 YLW CONV (MISCELLANEOUS) ×4 IMPLANT
PATTIES SURGICAL .5 X1 (DISPOSABLE) ×1 IMPLANT
PATTIES SURGICAL .5 X3 (DISPOSABLE) IMPLANT
PATTIES SURGICAL .5X1.5 (GAUZE/BANDAGES/DRESSINGS) ×2 IMPLANT
PATTIES SURGICAL .75X.75 (GAUZE/BANDAGES/DRESSINGS) ×2 IMPLANT
PROBE PEDCLE PROBE MAGSTM DISP (MISCELLANEOUS) ×1 IMPLANT
ROD VIPER II LORDOSED 5.5X35 (Rod) ×1 IMPLANT
ROD VIPER II LORDOSED 5.5X40 (Rod) ×1 IMPLANT
SCREW POLY VIPER2 7X40MM (Screw) ×3 IMPLANT
SCREW SET SINGLE INNER MIS (Screw) ×4 IMPLANT
SCREW XTAB POLY VIPER  7X45 (Screw) ×1 IMPLANT
SCREW XTAB POLY VIPER 7X45 (Screw) IMPLANT
SPONGE INTESTINAL PEANUT (DISPOSABLE) ×7 IMPLANT
SPONGE LAP 18X18 RF (DISPOSABLE) ×1 IMPLANT
SPONGE LAP 4X18 RFD (DISPOSABLE) IMPLANT
SPONGE SURGIFOAM ABS GEL 100 (HEMOSTASIS) ×3 IMPLANT
STAPLER VISISTAT 35W (STAPLE) IMPLANT
STRIP CLOSURE SKIN 1/2X4 (GAUZE/BANDAGES/DRESSINGS) ×4 IMPLANT
SURGIFLO W/THROMBIN 8M KIT (HEMOSTASIS) IMPLANT
SUT MNCRL AB 4-0 PS2 18 (SUTURE) ×4 IMPLANT
SUT PDS AB 1 CTX 36 (SUTURE) ×4 IMPLANT
SUT PROLENE 4 0 RB 1 (SUTURE)
SUT PROLENE 4-0 RB1 .5 CRCL 36 (SUTURE) IMPLANT
SUT PROLENE 5 0 C 1 24 (SUTURE) IMPLANT
SUT PROLENE 5 0 CC1 (SUTURE) IMPLANT
SUT PROLENE 6 0 C 1 30 (SUTURE) ×2 IMPLANT
SUT PROLENE 6 0 CC (SUTURE) IMPLANT
SUT SILK 0 TIES 10X30 (SUTURE) ×2 IMPLANT
SUT SILK 2 0 TIES 10X30 (SUTURE) ×3 IMPLANT
SUT SILK 2 0SH CR/8 30 (SUTURE) IMPLANT
SUT SILK 3 0 TIES 10X30 (SUTURE) ×1 IMPLANT
SUT SILK 3 0 TIES 17X18 (SUTURE) ×1
SUT SILK 3 0SH CR/8 30 (SUTURE) IMPLANT
SUT SILK 3-0 18XBRD TIE BLK (SUTURE) ×1 IMPLANT
SUT VIC AB 0 CT1 18XCR BRD 8 (SUTURE) ×2 IMPLANT
SUT VIC AB 0 CT1 8-18 (SUTURE) ×2
SUT VIC AB 1 CT1 18XCR BRD 8 (SUTURE) ×2 IMPLANT
SUT VIC AB 1 CT1 27 (SUTURE)
SUT VIC AB 1 CT1 27XBRD ANBCTR (SUTURE) ×2 IMPLANT
SUT VIC AB 1 CT1 8-18 (SUTURE) ×3
SUT VIC AB 1 CTX 36 (SUTURE)
SUT VIC AB 1 CTX36XBRD ANBCTR (SUTURE) ×2 IMPLANT
SUT VIC AB 2-0 CT2 18 VCP726D (SUTURE) ×5 IMPLANT
SYNCAGE EVOL SM 15X8.5 18D (Spacer) ×1 IMPLANT
SYR 20ML LL LF (SYRINGE) ×3 IMPLANT
SYR BULB IRRIGATION 50ML (SYRINGE) ×3 IMPLANT
SYR CONTROL 10ML LL (SYRINGE) ×4 IMPLANT
SYR TB 1ML LUER SLIP (SYRINGE) ×2 IMPLANT
TAP CANN VIPER2 DL 6.0 (TAP) ×2 IMPLANT
TAPE CLOTH SURG 6X10 WHT LF (GAUZE/BANDAGES/DRESSINGS) ×1 IMPLANT
TOWEL GREEN STERILE (TOWEL DISPOSABLE) ×4 IMPLANT
TOWEL GREEN STERILE FF (TOWEL DISPOSABLE) ×2 IMPLANT
TRAY FOLEY MTR SLVR 16FR STAT (SET/KITS/TRAYS/PACK) ×1 IMPLANT
TRAY FOLEY W/BAG SLVR 16FR (SET/KITS/TRAYS/PACK) ×1
TRAY FOLEY W/BAG SLVR 16FR ST (SET/KITS/TRAYS/PACK) ×1 IMPLANT
WATER STERILE IRR 1000ML POUR (IV SOLUTION) ×3 IMPLANT
YANKAUER SUCT BULB TIP NO VENT (SUCTIONS) ×2 IMPLANT

## 2019-07-18 NOTE — H&P (Signed)
PREOPERATIVE H&P  Chief Complaint: Right leg pain  HPI: Jill Cameron is a 63 y.o. female who presents with ongoing pain in the right leg  MRI reveals severe NF stenosis and segmental collapse on the right at L5/S1  Patient has failed multiple forms of conservative care and continues to have pain (see office notes for additional details regarding the patient's full course of treatment)  Past Medical History:  Diagnosis Date  . Back pain    chronic  . 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 Lumbar radiculopathy    L5  . Shoulder pain    right   Past Surgical History:  Procedure Laterality Date  . DILATION AND CURETTAGE OF UTERUS    . ROTATOR CUFF REPAIR Right   . TONSILLECTOMY AND ADENOIDECTOMY     Social History   Socioeconomic History  . Marital status: Married    Spouse name: Not on file  . Number of children: Not on file  . Years of education: Not on file  . Highest education level: Not on file  Occupational History  . Not on file  Social Needs  . Financial resource strain: Not on file  . Food insecurity    Worry: Not on file    Inability: Not on file  . Transportation needs    Medical: Not on file    Non-medical: Not on file  Tobacco Use  . Smoking status: Never Smoker  . Smokeless tobacco: Never Used  Substance and Sexual Activity  . Alcohol use: Yes    Alcohol/week: 1.0 standard drinks    Types: 1 Glasses of wine per week  . Drug use: Never  . Sexual activity: Not on file  Lifestyle  . Physical activity    Days per week: Not on file    Minutes per session: Not on file  . Stress: Not on file  Relationships  . Social Herbalist on phone: Not on file    Gets together: Not on file    Attends religious service: Not on file    Active member of club or organization: Not on file    Attends meetings of clubs or organizations: Not on file    Relationship status: Not on file  Other Topics Concern  . Not on  file  Social History Narrative  . Not on file   History reviewed. No pertinent family history. Allergies  Allergen Reactions  . Neosporin [Neomycin-Bacitracin Zn-Polymyx] Rash   Prior to Admission medications   Medication Sig Start Date End Date Taking? Authorizing Provider  Bioflavonoid Products (ESTER C PO) Take 1 tablet by mouth daily.   Yes [provider]  calcium carbonate (OSCAL) 1500 (600 Ca) MG TABS tablet Take 600 mg of elemental calcium by mouth daily with breakfast.   Yes [provider]  diclofenac (VOLTAREN) 75 MG EC tablet Take 75 mg by mouth 2 (two) times daily.  03/04/19  Yes [provider]  estradiol (ESTRACE) 0.1 MG/GM vaginal cream Place 1 Applicatorful vaginally 3 (three) times a week.  02/23/19  Yes [provider]  gabapentin (NEURONTIN) 300 MG capsule Take 300 mg by mouth 3 (three) times daily.  02/07/19  Yes [provider]  Multiple Vitamin (MULTIVITAMIN) tablet Take 1 tablet by mouth daily.   Yes [provider]  PARoxetine Mesylate (BRISDELLE) 7.5 MG CAPS Take 7.5 mg by mouth at bedtime.  01/27/15  Yes [provider]     All other systems have been reviewed and were otherwise negative with the exception of those mentioned in the HPI and as above.  Physical Exam: Vitals:   07/18/19 0611  BP: (!) 160/83  Pulse: 79  Resp: 20  Temp: (!) 97.3 F (36.3 C)  SpO2: 100%    Body mass index is 25.24 kg/m.  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

## 2019-07-18 NOTE — Op Note (Signed)
PATIENT NAME: Jill Cameron RECORD NO.:   [id number]    PHYSICIAN:  Phylliss Bob, MD      DATE OF BIRTH: 1956-04-05   DATE OF PROCEDURE: 07/18/2019                              OPERATIVE REPORT   PREOPERATIVE DIAGNOSES: 1. Axial low back pain. 2. Right L5 radiculopathy. 3. Severe right segmental collapse, L5-S1 4. Severe right L5/S1 stenosis  POSTOPERATIVE DIAGNOSES: 1. Axial low back pain. 2. Right L5 radiculopathy. 3. Severe right segmental collapse, L5-S1 4. Severe right L5/S1 stenosis  PROCEDURE: 1. Anterior lumbar interbody fusion, L5-S1 (expsure peformed by Dr. Fortunato Curling) 2. Insertion of interbody device x 1 (Syncage Evolution intervertebral spacer). 3. Placement of anterior instrumentation, L5-S1. 4. Intraoperative use of fluoroscopy. 5. Posterior spinal fusion, L5-S1. 6. Placement of posterior instrumentation, L5, S1. 7. Use of morselized allograft - Vivigen  SURGEON:  Phylliss Bob, MD  ASSISTANT:  Pricilla Holm, PA-C  ANESTHESIA:  General endotracheal anesthesia.  COMPLICATIONS:  None.  DISPOSITION:  Stable.  ESTIMATED BLOOD LOSS:  Minimal  INDICATIONS FOR SURGERY:  Briefly, Ms. Hiltner is a very pleasant 63 year old female, who did present to me with severe and debilitating pain in Her right leg. The patient's MRI was notable for degenerative disk disease at L5-S1, with severe right-sided segmental collapse, resulting in severe compression of the exiting right L5 nerve.  Given the patient's ongoing pain and dysfunction, we did discuss proceeding with the procedure noted above.  The patient was fully aware that surgery would not guarantee resolution of her right leg pain. The patient did elect to proceed.  OPERATIVE DETAILS:  On 07/18/2019, the patient was brought to surgery and general endotracheal anesthesia was administered.  The patient was placed supine on the hospital bed.  The patient's abdomen was prepped and draped in the usual sterile  fashion.  An anterior retroperitoneal approach was then performed by Dr. Carlis Abbott. Once the anterior lumbar spine was noted, we did focus our attention on the L5-S1 intervertebral space.  I then performed a thorough and complete L5-S1 intervertebral diskectomy to the level of the posterior longitudinal ligament.  I was very pleased with the diskectomy that I was able to accomplish.  The endplates were then appropriately prepared and the appropriate sized anterior intervertebral spacer (65mm, 18 degress, small) was packed with Vivigen and tamped into position. This was situated toward the left aspect of the intervertebral space, given the severe collapse on the right. I was pleased with the press-fit of the implant.  I then proceeded with placement of anterior instrumentation at the L5-S1 intervertebral space.  To accomplish this, I did use an awl to prepare the trajectory of anterior vertebral body screw.  An anterior buttress was attached to the back end of the screw and did help provide anterior fixation across the L5-S1 intervertebral space.  I was very pleased with the press-fit of the anterior hardware.   I was very pleased with the final AP and lateral fluoroscopic images.  The wound was copiously irrigated.  The fascia was closed using #1 PDS.  The subcutaneous layer was closed using 0 Vicryl followed by 2-0 Vicryl, and the skin was closed using 4-0 Monocryl. Benzoin and Steri-Strips were applied followed by sterile dressing.  The patient was then rolled prone onto a Jackson spinal bed.    The back was then prepped and draped  in the usual sterile fashion.  I then made paramedian incisions on the right and left sides, just lateral to the lateral borders of the pedicles from L5-S1.  On the left side, the posterolateral gutter and posterior elements were identified and exposed and decorticated.  The remainder of the Vivgen was packed into the posterolateral gutter on the left side to aid in  the success of the posterior fusion.  I then tapped the L5 and S1 pedicles bilaterally up to a 6 mm tap.  Of note, I did use neurologic monitoring and I did test each of the taps using triggered EMG.  There was no tap that tested below 10 milliamps.  I then placed 7 mm screws bilaterally at S1 and 7 mm screws bilaterally at L5.  Rods were then secured into the tulip heads of the screws bilaterally.  Caps were then placed and a final locking procedure was performed.  I was very pleased with the final AP and lateral fluoroscopic images.  The wound was then copiously irrigated.  On the right and left sides, the fascia was closed using #1 Vicryl.  The subcutaneous layer was closed using 0 Vicryl followed by 2-0 Vicryl, and the skin was then closed using 4-0 Monocryl. Benzoin and Steri-Strips were applied followed by sterile dressing.  All instrument counts were correct at the termination of the procedure. Again, I did use neurologic monitoring throughout the surgery, and there was no abnormal EMG activity noted throughout the surgery.  Of note, Pricilla Holm was my assistant throughout surgery, and did aid in retraction, suctioning, and closure for both the anterior and posterior portions of the procedure.   Phylliss Bob, MD

## 2019-07-18 NOTE — H&P (Signed)
History and Physical Interval Note:  07/18/2019 7:16 AM  Jill Cameron  has presented today for surgery, with the diagnosis of RIGHT LUMBAR 5 RADICULOPATHY SECONDARY TO SEVERE SEGMENTAL COLLAPSE ACROSS THE LUMBAR 5 - SACRUM 1 INTERVERTEBRAL SPACE.Marland Kitchen  The various methods of treatment have been discussed with the patient and family. After consideration of risks, benefits and other options for treatment, the patient has consented to  Procedure(s): ANTERIOR LUMBAR INTERBODY FUSION LUMBAR 5- SACRUM1 WITH INSTRUMENATION AND ALLOGRAFT (N/A) POSTERIOR SPINAL FUSION WITH INSTRUMENTATION AND ALLOGRAFT (N/A) ABDOMINAL EXPOSURE (N/A) as a surgical intervention.  The patient's history has been reviewed, patient examined, no change in status, stable for surgery.  I have reviewed the patient's chart and labs.  Questions were answered to the patient's satisfaction.    L5-S1 ALIF  Jill Cameron  [facility] Specialist of [facility]  Patient name: Jill Cameron     MRN: [id number]        DOB: January 17, 1956          Sex: female  REASON FOR CONSULT: Discuss anterior exposure for L5-S1 lumbar fusion with Dr. Lynann Bologna  HPI: Jill Cameron is a 63 y.o. female, who is here today for discussion of anterior exposure.  She has a progressive history of low back pain and also now with weakness in her right leg.  This has been progressive.  She is failed conservative treatment and saw Dr. Lynann Bologna for further evaluation.  He is recommended L5-S1 interbody fusion from anterior and posterior approach.  She is here today for discussion and my role for exposure.  She has never had any intra-abdominal surgery.  Is no history of cardiac disease and no history of peripheral vascular occlusive disease.      Past Medical History:  Diagnosis Date  . Back pain    chronic  . Shoulder pain    right    History reviewed. No pertinent family history.  SOCIAL HISTORY: Social History   Socioeconomic History  .  Marital status: Unknown    Spouse name: Not on file  . Number of children: Not on file  . Years of education: Not on file  . Highest education level: Not on file  Occupational History  . Not on file  Social Needs  . Financial resource strain: Not on file  . Food insecurity:    Worry: Not on file    Inability: Not on file  . Transportation needs:    Medical: Not on file    Non-medical: Not on file  Tobacco Use  . Smoking status: Never Smoker  . Smokeless tobacco: Never Used  Substance and Sexual Activity  . Alcohol use: Yes    Alcohol/week: 1.0 standard drinks    Types: 1 Glasses of wine per week  . Drug use: Never  . Sexual activity: Not on file  Lifestyle  . Physical activity:    Days per week: Not on file    Minutes per session: Not on file  . Stress: Not on file  Relationships  . Social connections:    Talks on phone: Not on file    Gets together: Not on file    Attends religious service: Not on file    Active member of club or organization: Not on file    Attends meetings of clubs or organizations: Not on file    Relationship status: Not on file  . Intimate partner violence:    Fear of current or ex partner: Not on file  Emotionally abused: Not on file    Physically abused: Not on file    Forced sexual activity: Not on file  Other Topics Concern  . Not on file  Social History Narrative  . Not on file        Allergies  Allergen Reactions  . Neosporin [Neomycin-Bacitracin Zn-Polymyx]           Current Outpatient Medications  Medication Sig Dispense Refill  . diclofenac (VOLTAREN) 75 MG EC tablet     . estradiol (ESTRACE) 0.1 MG/GM vaginal cream I 1 GRAM VAGINALLY 3 TIMES A WK HS    . gabapentin (NEURONTIN) 300 MG capsule TK 1 C PO TID PRF PAIN    . Multiple Vitamin (MULTIVITAMIN) tablet Take 1 tablet by mouth daily.    Marland Kitchen PARoxetine Mesylate (BRISDELLE) 7.5 MG CAPS Take by mouth.     No current  facility-administered medications for this visit.     REVIEW OF SYSTEMS:  [X]  denotes positive finding, [ ]  denotes negative finding Cardiac  Comments:  Chest pain or chest pressure:    Shortness of breath upon exertion:    Short of breath when lying flat:    Irregular heart rhythm:        Vascular    Pain in calf, thigh, or hip brought on by ambulation: x  neurogenic  Pain in feet at night that wakes you up from your sleep:  x  neurogenic  Blood clot in your veins:    Leg swelling:         Pulmonary    Oxygen at home:    Productive cough:     Wheezing:         Neurologic    Sudden weakness in arms or legs:     Sudden numbness in arms or legs:     Sudden onset of difficulty speaking or slurred speech:    Temporary loss of vision in one eye:     Problems with dizziness:         Gastrointestinal    Blood in stool:     Vomited blood:         Genitourinary    Burning when urinating:     Blood in urine:        Psychiatric    Major depression:         Hematologic    Bleeding problems:    Problems with blood clotting too easily:        Skin    Rashes or ulcers:        Constitutional    Fever or chills:      PHYSICAL EXAM:    Vitals:   03/15/19 1123  BP: (!) 138/91  Pulse: 68  Resp: 18  Temp: 97.9 F (36.6 C)  SpO2: 98%  Weight: 135 lb (61.2 kg)  Height: 5\' 2"  (1.575 m)    GENERAL: The patient is a well-nourished female, in no acute distress. The vital signs are documented above. CARDIOVASCULAR: Carotid arteries without bruits bilaterally.  2+ radial, 2+ femoral and 2+ dorsalis pedis pulses bilaterally PULMONARY: There is good air exchange  ABDOMEN: Soft and non-tender  MUSCULOSKELETAL: There are no major deformities or cyanosis. NEUROLOGIC: No focal weakness or paresthesias are detected. SKIN: There are no ulcers or rashes noted. PSYCHIATRIC: The patient  has a normal affect.  DATA:  None  MEDICAL ISSUES: Had a long discussion with the patient regarding anterior exposure.  I explained mobilization of the rectus muscle, intraperitoneal  contents in the retroperitoneal space, left ureter and arterial and venous structures overlying the spine.  Discussed the potential injury to all of these in particular discussed the potential injury for major venous structures.  Any contraindication for anterior exposure.  Will be available to proceed with surgery.  We did discuss the somewhat uncertainty regarding elective posting due to the [place] pandemic.  We will coordinate surgery through Dr. Laurena Bering office   Rosetta Posner, MD [facility] Tel [phone] Pager [phone]

## 2019-07-18 NOTE — Transfer of Care (Signed)
Immediate Anesthesia Transfer of Care Note  Patient: Jill Cameron  Procedure(s) Performed: ANTERIOR LUMBAR INTERBODY FUSION LUMBAR FIVE- SACRUM ONE WITH INSTRUMENATION AND ALLOGRAFT (N/A ) POSTERIOR SPINAL FUSION WITH INSTRUMENTATION AND ALLOGRAFT (N/A ) ABDOMINAL EXPOSURE (N/A )  Patient Location: PACU  Anesthesia Type:General  Level of Consciousness: awake, alert , oriented and patient cooperative  Airway & Oxygen Therapy: Patient Spontanous Breathing and Patient connected to nasal cannula oxygen  Post-op Assessment: Report given to RN and Post -op Vital signs reviewed and stable  Post vital signs: Reviewed and stable  Last Vitals:  Vitals Value Taken Time  BP 136/93 07/18/19 1212  Temp    Pulse 82 07/18/19 1213  Resp 11 07/18/19 1214  SpO2 100 % 07/18/19 1213  Vitals shown include unvalidated device data.  Last Pain:  Vitals:   07/18/19 0611  TempSrc: Oral  PainSc: 6          Complications: No apparent anesthesia complications

## 2019-07-18 NOTE — Op Note (Signed)
Date: July 18, 2019  Preoperative diagnosis: Chronic lower back pain with right lower extremity radiculopathy  Postoperative diagnosis: Same  Procedure: Anterior spine exposure at L5-S1 disc space using a left retroperitoneal approach  Surgeon: Dr. [name], MD  Co-surgeon: Dr. Phylliss Bob, MD  Indications: Patient is a 63 year old female who has had ongoing back pain with right lower extremity radiculopathy.  She has been evaluated by Dr. 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  She presents today for planned anterior lumbar interbody fusion at L5-S1 and vascular surgery has been asked to assist with anterior spine exposure.  Risk benefits were discussed in detail with the patient as previously documented.  Findings: Transverse incision over the left rectus muscle and mobilized the left rectus and then entered the retroperitoneal space with mobilization of the peritoneum and abdominal contents across midline.  Ultimately the L5-S1 disc space was identified and this required mobilization of the left iliac vein.  Spinal needle was placed in the space and confirmed on lateral fluoroscopy.  At that point in time fixed retractor was placed and the case was turned over to Dr. Lynann Bologna.  Anesthesia: General  Details: Patient was taken to the operating room after informed consent was obtained.  General endotracheal anesthesia was induced.  After she was asleep we subsequently used fluoroscopic C arm in the lateral position to identify the L5-S1 disc space and it was then marked on the anterior abdominal wall.  The abdominal wall was then prepped and draped in usual sterile fashion.  Timeout was performed to identify patient, procedure and site.  Initially made a transverse incision over the marking on the abdominal wall with a 15 blade scalpel.  Dissected down through the subcutaneous tissue with Bovie cautery and used fixed cerebellum retractors for added  exposure.  Once we got down to the anterior fascia this was opened transversely with Bovie cautery.  Used hemostats to pull up on the fascia mobilized small flaps underneath the fascia.  I then circumferentially mobilized the left rectus muscle.  I then entered the retroperitoneal space and identify the peritoneum which was mobilized medial across midline out of the retroperitoneum as well as mobilizing the left ureter with abdominal contents.  At that point in time I then placed a lap pad superiorly in the wound after I swept rectus muscle lateral with a Balfour retractor.  My assistant on the other side of the table then used fixed hand-held retractors to pull the peritoneum contents across midline.  I then used suction and KD to continue bluntly mobilizing the peritoneal contents off the left iliac artery and vein across the midline and the L5-S1 disc space was identified.  The middle sacral vessels were then clipped between vessel clips and divided.  The left iliac vein was mobilized to allow fix retractors to be placed.  I then placed a fixed Thompson retractor.  I placed 140 reversed lip retractors medial and lateral with 150 malleables cranial and caudal to get adequate exposure of L5-S1 disc space.  That point in time the spinal needle was placed in the space exposed.  A lateral fluoroscopic image was obtained with the C arm to confirm we were at the correct space.  The case was then turned over to Dr. Lynann Bologna.  Complication: None  Condition: Stable  [name], MD [facility] [place] Office: [phone] Pager: [place]

## 2019-07-18 NOTE — Anesthesia Procedure Notes (Signed)
Arterial Line Insertion Start/End8/31/2020 6:30 AM, 07/18/2019 6:37 AM Performed by: Kathryne Hitch, CRNA, CRNA  Preanesthetic checklist: patient identified, IV checked, site marked, risks and benefits discussed, surgical consent and monitors and equipment checked Lidocaine 1% used for infiltration and patient sedated radial was placed Catheter size: 20 G Maximum sterile barriers used   Attempts: 1 Procedure performed without using ultrasound guided technique. Following insertion, dressing applied and Biopatch. Post procedure assessment: normal  Patient tolerated the procedure well with no immediate complications.

## 2019-07-18 NOTE — Anesthesia Postprocedure Evaluation (Signed)
Anesthesia Post Note  Patient: Jill Cameron  Procedure(s) Performed: ANTERIOR LUMBAR INTERBODY FUSION LUMBAR FIVE- SACRUM ONE WITH INSTRUMENATION AND ALLOGRAFT (N/A ) POSTERIOR SPINAL FUSION WITH INSTRUMENTATION AND ALLOGRAFT (N/A ) ABDOMINAL EXPOSURE (N/A )     Patient location during evaluation: PACU Anesthesia Type: General Level of consciousness: sedated and patient cooperative Pain management: pain level controlled Vital Signs Assessment: post-procedure vital signs reviewed and stable Respiratory status: spontaneous breathing Cardiovascular status: stable Anesthetic complications: no    Last Vitals:  Vitals:   07/18/19 1245 07/18/19 1300  BP: (!) 143/85 135/79  Pulse: 90 90  Resp: 16 17  Temp:  (!) 36.2 C  SpO2: 100% 95%    Last Pain:  Vitals:   07/18/19 1300  TempSrc:   PainSc: [place]

## 2019-07-19 ENCOUNTER — Encounter (HOSPITAL_COMMUNITY): Payer: Self-pay | Admitting: Orthopedic Surgery

## 2019-07-19 MED ORDER — DIAZEPAM 5 MG PO TABS: 5.0000 mg | ORAL_TABLET | Freq: Four times a day (QID) | ORAL | 0 refills | Status: AC | PRN

## 2019-07-19 MED ORDER — OXYCODONE-ACETAMINOPHEN 5-325 MG PO TABS: 1.0000 | ORAL_TABLET | ORAL | 0 refills | Status: AC | PRN

## 2019-07-19 MED FILL — Heparin Sodium (Porcine) Inj 1000 Unit/ML: INTRAMUSCULAR | Qty: 30 | Status: AC

## 2019-07-19 MED FILL — Sodium Chloride IV Soln 0.9%: INTRAVENOUS | Qty: 1000 | Status: AC

## 2019-07-19 NOTE — Progress Notes (Signed)
    Patient doing well  Has been walking Denies leg pain   Physical Exam: Vitals:   07/19/19 0543 07/19/19 0717  BP: 102/74 106/68  Pulse: 77 81  Resp:  16  Temp:  98.5 F (36.9 C)  SpO2:  99%    Dressings in place NVI  POD #1 s/p L5/S1 A/P fusion, doing well  - up with PT/OT, encourage ambulation - Percocet for pain, Valium for muscle spasms - likely d/c home today with f/u in 2 weeks

## 2019-07-19 NOTE — Progress Notes (Signed)
[facility] of [place]  Subjective  - Doing well.  OOB and working with therapy.   Objective 106/68 81 98.5 F (36.9 C) (Oral) 16 99%  Intake/Output Summary (Last 24 hours) at 07/19/2019 0818 Last data filed at 07/18/2019 1300 Gross per 24 hour  Intake 1850 ml  Output 610 ml  Net 1240 ml    Abdomen soft and expected post-op tenderness Palpable PT pulses bilatearlly  Laboratory Lab Results: No results for input(s): WBC, HGB, HCT, PLT in the last 72 hours. BMET No results for input(s): NA, K, CL, CO2, GLUCOSE, BUN, CREATININE, CALCIUM in the last 72 hours.  COAG Lab Results  Component Value Date   INR 1.0 07/14/2019   No results found for: PTT  Assessment/Planning: POD #1 s/p L5-S1 ALIF.  Doing well this am.  [facility] for discharge from my standpoint.  Call with questions or concerns.  [name] 07/19/2019 8:18 AM --

## 2019-07-19 NOTE — Plan of Care (Signed)
Pt doing well. Pt given D/C instructions with verbal understanding. Rx's were sent to pharmacy by MD. Pt's incisions are clean and dry with no sign of infection. Pt's IV was removed prior to D/C. RW was given to Pt per MD order. Pt D/C'd home via wheelchair per MD order. Pt is stable @ D/C and has no other needs at this time. Holli Humbles, RN

## 2019-07-19 NOTE — Evaluation (Signed)
Physical Therapy Evaluation Patient Details Name: Jill Cameron MRN: [id number] DOB: Apr 30, 1956 Today's Date: 07/19/2019   History of Present Illness  63 yo female presenting with right leg pain. 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 s/p Anterior lumbar interbody fusion (L5-S1). PMH including chronic back pain, cancer, and rotator cuff repair (right).  Clinical Impression  Pt admitted with above diagnosis. At the time of PT eval pt was able to perform transfers and ambulation with gross min guard assist to supervision for safety with RW for support. Pt was educated on precautions, brace application/wearing schedule, activity progression and car transfer. Pt currently with functional limitations due to the deficits listed below (see PT Problem List). Pt will benefit from skilled PT to increase their independence and safety with mobility to allow discharge to the venue listed below.       Follow Up Recommendations No PT follow up;Supervision for mobility/OOB    Equipment Recommendations  Rolling walker with 5" wheels    Recommendations for Other Services       Precautions / Restrictions Precautions Precaution Comments: Recent falls Required Braces or Orthoses: Spinal Brace Spinal Brace: Thoracolumbosacral orthotic;Applied in sitting position Restrictions Weight Bearing Restrictions: No      Mobility  Bed Mobility Overal bed mobility: Needs Assistance Bed Mobility: Rolling;Sidelying to Sit Rolling: Modified independent (Device/Increase time) Sidelying to sit: Min guard       General bed mobility comments: Min guard at shoulder to elevate to full sitting position. Increased time and effort required due to pain.   Transfers Overall transfer level: Needs assistance Equipment used: Rolling walker (2 wheeled) Transfers: Sit to/from Stand Sit to Stand: Supervision         General transfer comment: Close supervision for safety as pt requiring increased  time and effort to achieve full stand. VC's for hand placement on seated surface for safety.   Ambulation/Gait Ambulation/Gait assistance: Min guard;Supervision Gait Distance (Feet): 250 Feet Assistive device: Rolling walker (2 wheeled) Gait Pattern/deviations: Step-through pattern;Decreased stride length;Trunk flexed Gait velocity: Decreased Gait velocity interpretation: <1.8 ft/sec, indicate of risk for recurrent falls General Gait Details: VC's for sequencing and general safety. Pt was able to improve posture with cues and maintain throughout gait training.   Stairs Stairs: Yes Stairs assistance: Min guard Stair Management: One rail Right;Step to pattern;Forwards Number of Stairs: 10 General stair comments: VC's for sequencing and general safety. Pt completed with close guard but no assistance from therapist.   Wheelchair Mobility    Modified Rankin (Stroke Patients Only)       Balance Overall balance assessment: Needs assistance Sitting-balance support: Feet supported;No upper extremity supported Sitting balance-Leahy Scale: Fair     Standing balance support: Bilateral upper extremity supported;During functional activity Standing balance-Leahy Scale: Poor Standing balance comment: Reliant on UE support during dynamic standing activity.                              Pertinent Vitals/Pain Pain Assessment: Faces Faces Pain Scale: Hurts little more Pain Location: Incision site Pain Descriptors / Indicators: Operative site guarding;Grimacing Pain Intervention(s): Limited activity within patient's tolerance;Monitored during session;Repositioned    Home Living Family/patient expects to be discharged to:: Private residence Living Arrangements: Spouse/significant other Available Help at Discharge: Family;Available 24 hours/day Type of Home: House Home Access: Stairs to enter   [employer] of Steps: 3 Home Layout: Able to live on main level with  bedroom/bathroom;Two level  Home Equipment: None Additional Comments: Has a rescue dog    Prior Function Level of Independence: Independent         Comments: ADLs, IADLs, driving     Hand Dominance        Extremity/Trunk Assessment   Upper Extremity Assessment Upper Extremity Assessment: Defer to OT evaluation    Lower Extremity Assessment Lower Extremity Assessment: Generalized weakness;RLE deficits/detail RLE Deficits / Details: Decreased strength and muscular endurance consistent with pre-op diagnosis    Cervical / Trunk Assessment Cervical / Trunk Assessment: Other exceptions Cervical / Trunk Exceptions: s/p surgery  Communication   Communication: No difficulties  Cognition Arousal/Alertness: Awake/alert Behavior During Therapy: WFL for tasks assessed/performed Overall Cognitive Status: Within Functional Limits for tasks assessed                                        General Comments      Exercises     Assessment/Plan    PT Assessment Patient needs continued PT services  PT Problem List Decreased strength;Decreased activity tolerance;Decreased balance;Decreased mobility;Decreased knowledge of use of DME;Decreased safety awareness;Decreased knowledge of precautions;Pain       PT Treatment Interventions DME instruction;Gait training;Stair training;Functional mobility training;Therapeutic activities;Therapeutic exercise;Neuromuscular re-education;Patient/family education    PT Goals (Current goals can be found in the Care Plan section)  Acute Rehab PT Goals Patient Stated Goal: Home today PT Goal Formulation: With patient Time For Goal Achievement: 07/26/19 Potential to Achieve Goals: Good    Frequency Min 5X/week   Barriers to discharge        Co-evaluation               AM-PAC PT "6 Clicks" Mobility  Outcome Measure Help needed turning from your back to your side while in a flat bed without using bedrails?: None Help  needed moving from lying on your back to sitting on the side of a flat bed without using bedrails?: A Little Help needed moving to and from a bed to a chair (including a wheelchair)?: A Little Help needed standing up from a chair using your arms (e.g., wheelchair or bedside chair)?: A Little Help needed to walk in hospital room?: A Little Help needed climbing 3-5 steps with a railing? : A Little 6 Click Score: 19    End of Session Equipment Utilized During Treatment: Gait belt Activity Tolerance: Patient tolerated treatment well Patient left: with call bell/phone within reach;Other (comment)(Sitting EOB awaiting OT) Nurse Communication: Mobility status PT Visit Diagnosis: Unsteadiness on feet (R26.81);Pain Pain - part of body: (back)    Time: [id number] PT Time Calculation (min) (ACUTE ONLY): 42 min   Charges:   PT Evaluation $PT Eval Moderate Complexity: 1 Mod PT Treatments $Gait Training: 23-37 mins        Rolinda Roan, PT, DPT Acute Rehabilitation Services Pager: [phone] Office: [phone]   Thelma Comp 07/19/2019, 11:22 AM

## 2019-07-19 NOTE — Evaluation (Signed)
Occupational Therapy Evaluation Patient Details Name: Jill Cameron MRN: [id number] DOB: 28-May-1956 Today's Date: 07/19/2019    History of Present Illness 63 yo female presenting with right leg pain. 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 s/p Anterior lumbar interbody fusion (L5-S1). PMH including chronic back pain, cancer, and rotator cuff repair (right).   Clinical Impression   PTA, pt was living with her husband and was independent. Currently, pt performing ADLs and functional mobility using RW at [place] level. Provided education and handout on brace management, back precautions, bed mobility, grooming, LB ADLs, toileting, and tub transfer; pt demonstrated and verbalized understanding. Pt requiring Min cues for sequencing for donning/doffing brace. Answered all pt questions. Recommend dc home once medically stable per physician. All acute OT needs met and will sign off. Thank you.     Follow Up Recommendations  No OT follow up    Equipment Recommendations  None recommended by OT    Recommendations for Other Services       Precautions / Restrictions Precautions Precautions: Back Precaution Booklet Issued: Yes (comment) Precaution Comments: Recalling 2/3 back precautions. Min cues for "no lifting" Required Braces or Orthoses: Spinal Brace Spinal Brace: Thoracolumbosacral orthotic;Applied in sitting position Restrictions Weight Bearing Restrictions: No      Mobility Bed Mobility Overal bed mobility: Needs Assistance Bed Mobility: Rolling;Sidelying to Sit Rolling: Modified independent (Device/Increase time) Sidelying to sit: Min guard       General bed mobility comments: Sitting at EOB upon arrival. Reviewed log roll technique  Transfers Overall transfer level: Needs assistance Equipment used: Rolling walker (2 wheeled) Transfers: Sit to/from Stand Sit to Stand: Supervision         General transfer comment: Close  supervision for safety.     Balance Overall balance assessment: Needs assistance Sitting-balance support: Feet supported;No upper extremity supported Sitting balance-Leahy Scale: Fair     Standing balance support: Bilateral upper extremity supported;During functional activity;No upper extremity supported Standing balance-Leahy Scale: Fair Standing balance comment: Able to static stand without UE support                           ADL either performed or assessed with clinical judgement   ADL Overall ADL's : Needs assistance/impaired                                       General ADL Comments: Pt performing ADLs and functional mobility at [facility] to [employer] A level. Requiring Min cues for brace management and seqeuncing. Provided education on back precautions, bed mobility, brace management, LB ADLs, toileting ,and tub transfer.      Vision         Perception     Praxis      Pertinent Vitals/Pain Pain Assessment: Faces Faces Pain Scale: Hurts little more Pain Location: Incision site Pain Descriptors / Indicators: Operative site guarding;Grimacing Pain Intervention(s): Monitored during session;Limited activity within patient's tolerance;Repositioned     Hand Dominance Right   Extremity/Trunk Assessment Upper Extremity Assessment Upper Extremity Assessment: Overall WFL for tasks assessed   Lower Extremity Assessment Lower Extremity Assessment: Defer to PT evaluation RLE Deficits / Details: Decreased strength and muscular endurance consistent with pre-op diagnosis   Cervical / Trunk Assessment Cervical / Trunk Assessment: Other exceptions Cervical / Trunk Exceptions: s/p back surgery   Communication Communication Communication:  No difficulties   Cognition Arousal/Alertness: Awake/alert Behavior During Therapy: WFL for tasks assessed/performed Overall Cognitive Status: Within Functional Limits for tasks assessed                                      General Comments       Exercises     Shoulder Instructions      Home Living Family/patient expects to be discharged to:: Private residence Living Arrangements: Spouse/significant other Available Help at Discharge: Family;Available 24 hours/day Type of Home: House Home Access: Stairs to enter [employer] of Steps: 3   Home Layout: Able to live on main level with bedroom/bathroom;Two level Alternate Level Stairs-Number of Steps: 13   Bathroom Shower/Tub: Teacher, early years/pre: Handicapped height     Home Equipment: None   Additional Comments: Has a rescue dog      Prior Functioning/Environment Level of Independence: Independent        Comments: ADLs, IADLs, driving        OT Problem List: Decreased range of motion;Impaired balance (sitting and/or standing);Decreased knowledge of use of DME or AE;Decreased knowledge of precautions      OT Treatment/Interventions:      OT Goals(Current goals can be found in the care plan section) Acute Rehab OT Goals Patient Stated Goal: Home today OT Goal Formulation: All assessment and education complete, DC therapy  OT Frequency:     Barriers to D/C:            Co-evaluation              AM-PAC OT "6 Clicks" Daily Activity     Outcome Measure Help from another person eating meals?: None Help from another person taking care of personal grooming?: None Help from another person toileting, which includes using toliet, bedpan, or urinal?: A Little Help from another person bathing (including washing, rinsing, drying)?: A Little Help from another person to put on and taking off regular upper body clothing?: A Little Help from another person to put on and taking off regular lower body clothing?: A Little 6 Click Score: 20   End of Session Equipment Utilized During Treatment: Back brace;Rolling walker Nurse Communication: Mobility status;Precautions  Activity  Tolerance: Patient tolerated treatment well Patient left: in chair;with call bell/phone within reach  OT Visit Diagnosis: Unsteadiness on feet (R26.81);Other abnormalities of gait and mobility (R26.89);Muscle weakness (generalized) (M62.81)                Time: 1517-6160 OT Time Calculation (min): 25 min Charges:  OT General Charges $OT Visit: 1 Visit OT Evaluation $OT Eval Low Complexity: 1 Low OT Treatments $Self Care/Home Management : 8-22 mins  Renny Remer MSOT, OTR/L Acute Rehab Pager: [phone] Office: [place] 07/19/2019, 11:36 AM

## 2019-07-21 NOTE — Discharge Summary (Signed)
Patient ID: Jill Cameron MRN: [id number] DOB/AGE: 12-14-1955 63 y.o.  Admit date: 07/18/2019 Discharge date: 07/19/2019  Admission Diagnoses:  Active Problems:   Radiculopathy   Discharge Diagnoses:  Same  Past Medical History:  Diagnosis Date  . Back pain    chronic  . 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 Lumbar radiculopathy    L5  . Shoulder pain    right    Surgeries: Procedure(s): ANTERIOR LUMBAR INTERBODY FUSION LUMBAR FIVE- SACRUM ONE WITH INSTRUMENATION AND ALLOGRAFT POSTERIOR SPINAL FUSION WITH INSTRUMENTATION AND ALLOGRAFT ABDOMINAL EXPOSURE on 07/18/2019   Consultants: Treatment Team:  [name], MD  Discharged Condition: Improved  Hospital Course: Jill Cameron is an 63 y.o. female who was admitted 07/18/2019 for operative treatment of radiculopathy. Patient has severe unremitting pain that affects sleep, daily activities, and work/hobbies. After pre-op clearance the patient was taken to the operating room on 07/18/2019 and underwent  Procedure(s): ANTERIOR LUMBAR INTERBODY FUSION LUMBAR FIVE- SACRUM ONE WITH INSTRUMENATION AND ALLOGRAFT POSTERIOR SPINAL FUSION WITH INSTRUMENTATION AND ALLOGRAFT ABDOMINAL EXPOSURE.    Patient was given perioperative antibiotics:  Anti-infectives (From admission, onward)   Start     Dose/Rate Route Frequency Ordered Stop   07/18/19 1930  ceFAZolin (ANCEF) IVPB 2g/100 mL premix     2 g 200 mL/hr over 30 Minutes Intravenous Every 8 hours 07/18/19 1331 07/19/19 0406   07/18/19 0600  ceFAZolin (ANCEF) IVPB 2g/100 mL premix     2 g 200 mL/hr over 30 Minutes Intravenous On call to O.R. 07/18/19 0550 07/18/19 1121       Patient was given sequential compression devices, early ambulation to prevent DVT.  Patient benefited maximally from hospital stay and there were no complications.    Recent vital signs: BP 106/68 (BP Location: Right Arm)   Pulse 81   Temp 98.5 F (36.9 C)  (Oral)   Resp 16   Ht 5\' 2"  (1.575 m)   Wt 62.6 kg   SpO2 99%   BMI 25.24 kg/m    Discharge Medications:   Allergies as of 07/19/2019      Reactions   Neosporin [neomycin-bacitracin Zn-polymyx] Rash      Medication List    TAKE these medications   Brisdelle 7.5 MG Caps Generic drug: PARoxetine Mesylate Take 7.5 mg by mouth at bedtime.   calcium carbonate 1500 (600 Ca) MG Tabs tablet Commonly known as: OSCAL Take 600 mg of elemental calcium by mouth daily with breakfast.   diazepam 5 MG tablet Commonly known as: VALIUM Take 1 tablet (5 mg total) by mouth every 6 (six) hours as needed for muscle spasms.   ESTER C PO Take 1 tablet by mouth daily.   estradiol 0.1 MG/GM vaginal cream Commonly known as: ESTRACE Place 1 Applicatorful vaginally 3 (three) times a week.   gabapentin 300 MG capsule Commonly known as: NEURONTIN Take 300 mg by mouth 3 (three) times daily.   multivitamin tablet Take 1 tablet by mouth daily.   oxyCODONE-acetaminophen 5-325 MG tablet Commonly known as: PERCOCET/ROXICET Take 1-2 tablets by mouth every 4 (four) hours as needed for moderate pain or severe pain.       Diagnostic Studies: Dg Lumbar Spine 2-3 Views  Result Date: 07/18/2019 CLINICAL DATA:  Anterior lumbar interbody fusion. EXAM: DG C-ARM 1-60 MIN; LUMBAR SPINE - 2-3 VIEW COMPARISON:  None. FLUOROSCOPY TIME:  Fluoroscopy Time:  2 minutes 56 seconds FINDINGS: Intraoperative images demonstrate  posterior pedicle screw and rod fixation at L5-S1. Disc spacer is in place. Anterior probe is noted. Second image demonstrates a screw in the anterior sacrum. IMPRESSION: Intraoperative localization of L5-S1 fusion. Electronically Signed   By: San Morelle M.D.   On: 07/18/2019 12:01   Dg C-arm 1-60 Min  Result Date: 07/18/2019 CLINICAL DATA:  Anterior lumbar interbody fusion. EXAM: DG C-ARM 1-60 MIN; LUMBAR SPINE - 2-3 VIEW COMPARISON:  None. FLUOROSCOPY TIME:  Fluoroscopy Time:  2 minutes  56 seconds FINDINGS: Intraoperative images demonstrate posterior pedicle screw and rod fixation at L5-S1. Disc spacer is in place. Anterior probe is noted. Second image demonstrates a screw in the anterior sacrum. IMPRESSION: Intraoperative localization of L5-S1 fusion. Electronically Signed   By: San Morelle M.D.   On: 07/18/2019 12:01   Dg Or Local Abdomen  Result Date: 07/18/2019 CLINICAL DATA:  Status post anterior lumbar fusion. Check for retained instruments. EXAM: OR LOCAL ABDOMEN COMPARISON:  None. FINDINGS: The patient has undergone anterior lumbar fusion at L5-S1. There are no retained surgical instruments. Bowel gas pattern is normal. Multilevel degenerative disc disease in the lower lumbar spine. IMPRESSION: No retained surgical instruments. Electronically Signed   By: Lorriane Shire M.D.   On: 07/18/2019 10:25    Disposition:    POD #1 s/p L5/S1 A/P fusion, doing well  - up with PT/OT, encourage ambulation - Percocet for pain, Valium for muscle spasms -Scripts for pain sent to pharmacy electronically  -D/C instructions sheet printed and in chart -D/C today  -F/U in office 2 weeks   Signed: Lennie Muckle Srihari Shellhammer 07/21/2019, 9:58 AM

## 2019-07-22 ENCOUNTER — Encounter (HOSPITAL_COMMUNITY): Payer: Self-pay | Admitting: Orthopedic Surgery

## 2020-05-17 IMAGING — RF DG LUMBAR SPINE 2-3V
1 series · 2 of 2 positions shown · non-contrast
Comparison: None.

FLUOROSCOPY TIME:  Fluoroscopy Time:  2 minutes 56 seconds

CLINICAL DATA: Anterior lumbar interbody fusion.

EXAM:
DG C-ARM 1-60 MIN; LUMBAR SPINE - 2-3 VIEW

[Series 1: run · 2 of 2 slices shown]
[im 1/2]
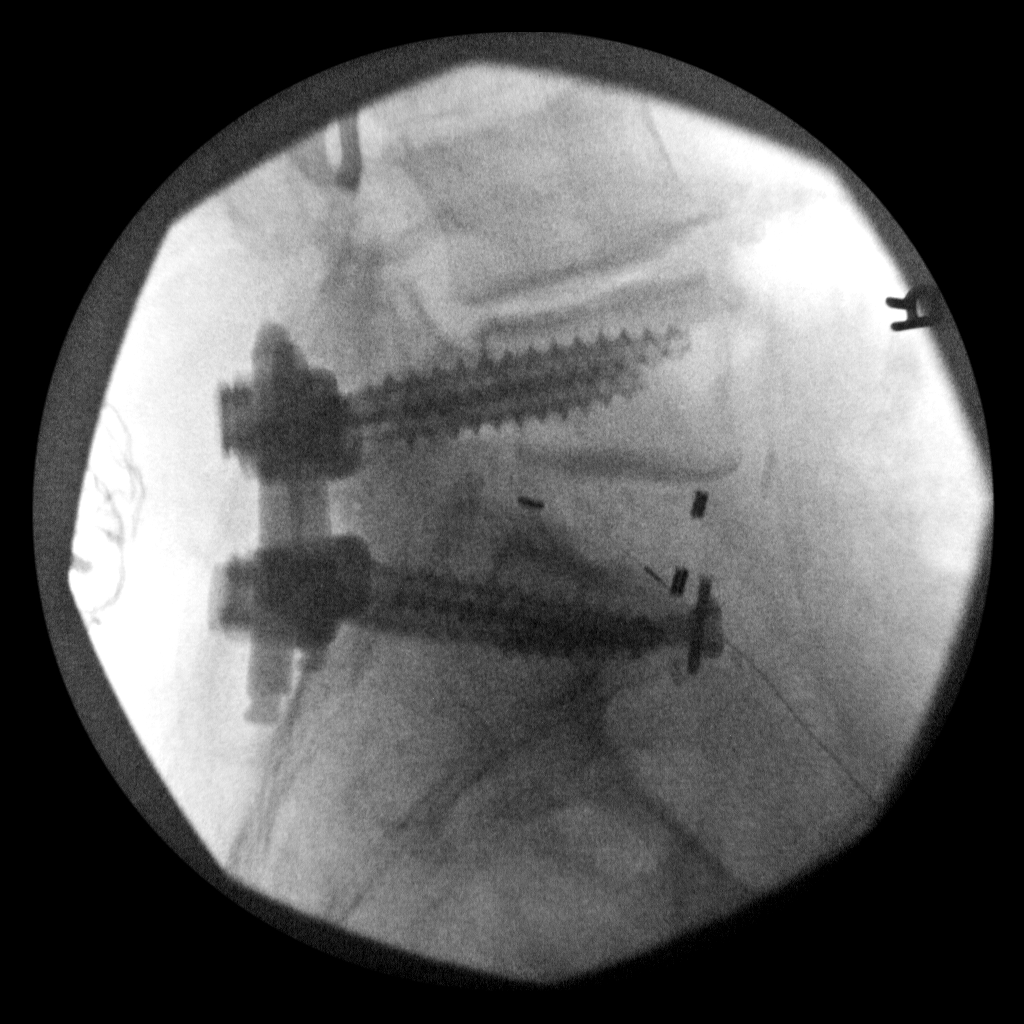
[im 2/2]
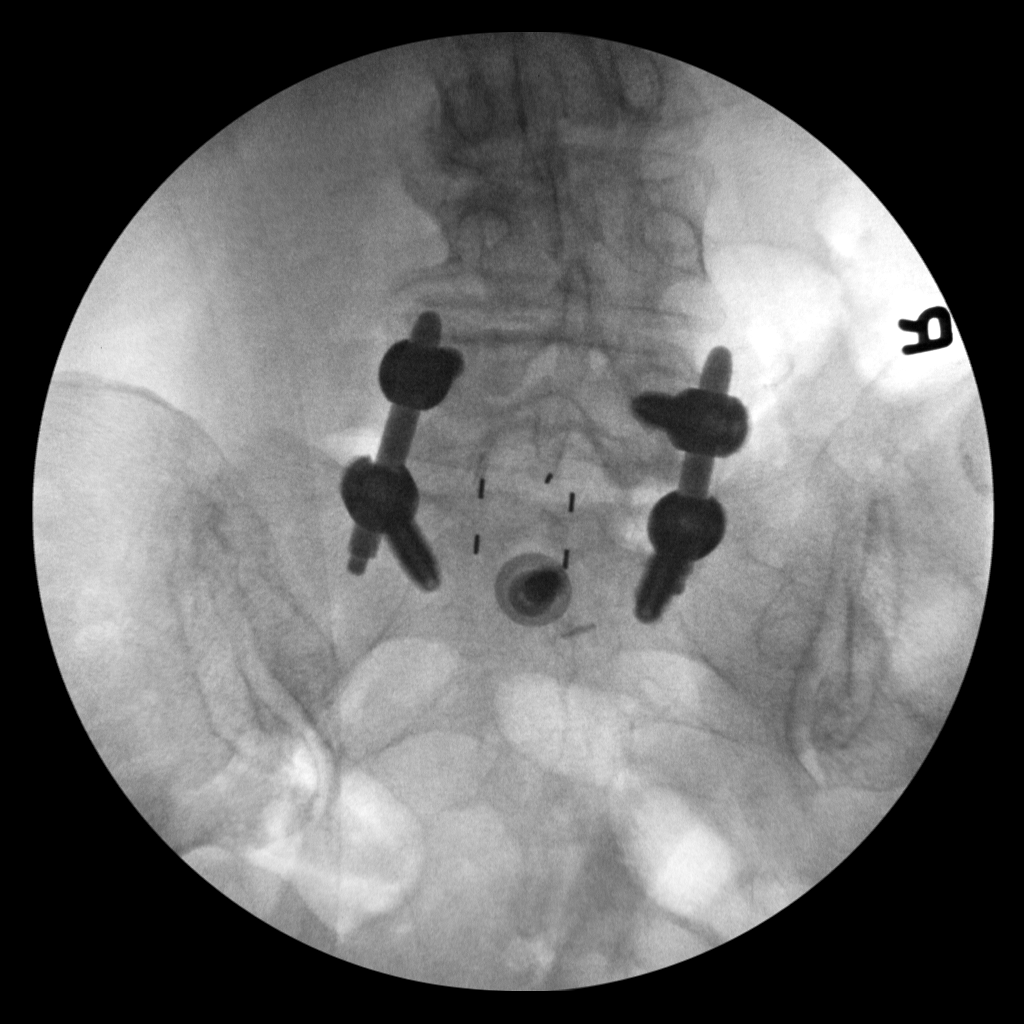

[2 of 2 positions shown; findings below may reference images not displayed]

FINDINGS: Intraoperative images demonstrate posterior pedicle screw and rod
fixation at L5-S1. Disc spacer is in place. Anterior probe is noted.
Second image demonstrates a screw in the anterior sacrum.
IMPRESSION: Intraoperative localization of L5-S1 fusion.

## 2020-05-17 IMAGING — RF DG C-ARM 1-60 MIN
1 series · 2 of 2 positions shown · non-contrast
Comparison: None.

FLUOROSCOPY TIME:  Fluoroscopy Time:  2 minutes 56 seconds

CLINICAL DATA: Anterior lumbar interbody fusion.

EXAM:
DG C-ARM 1-60 MIN; LUMBAR SPINE - 2-3 VIEW

[Series 1: run · 2 of 2 slices shown]
[im 1/2]
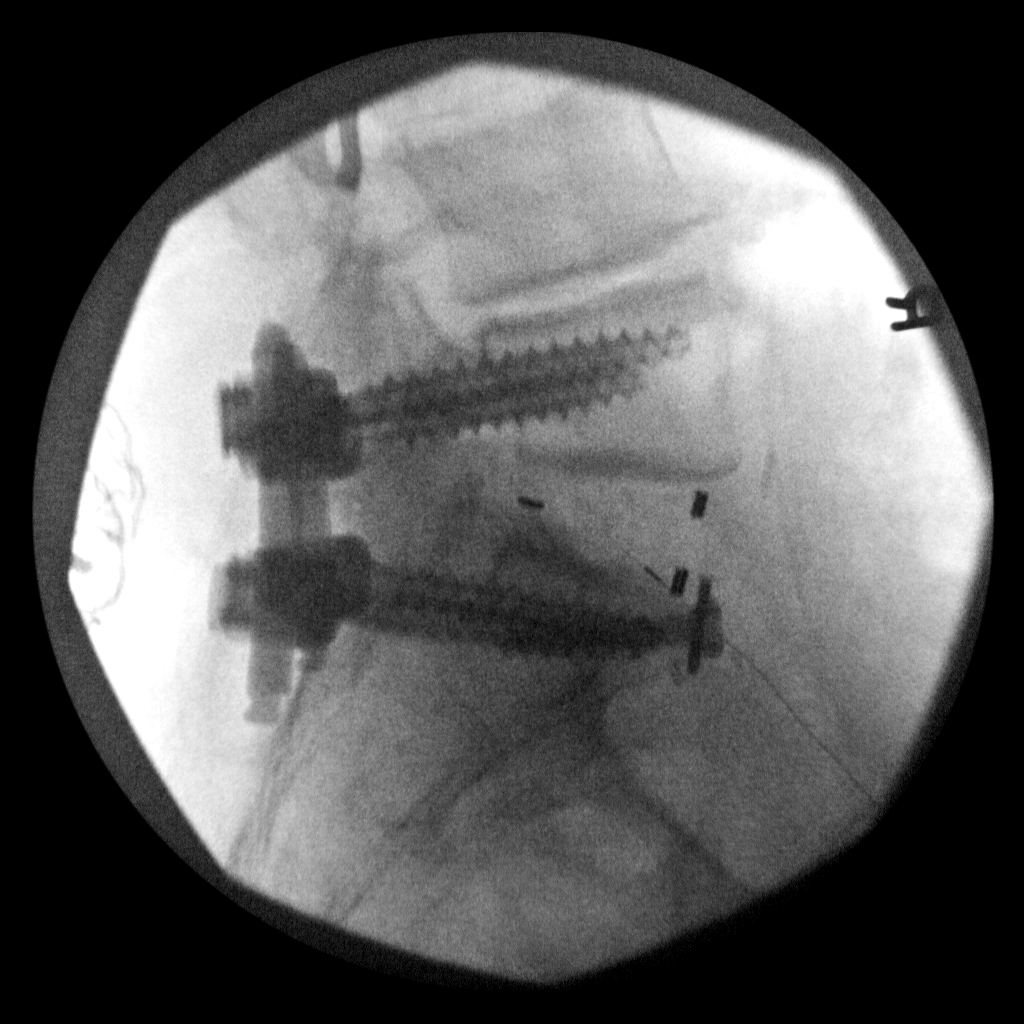
[im 2/2]
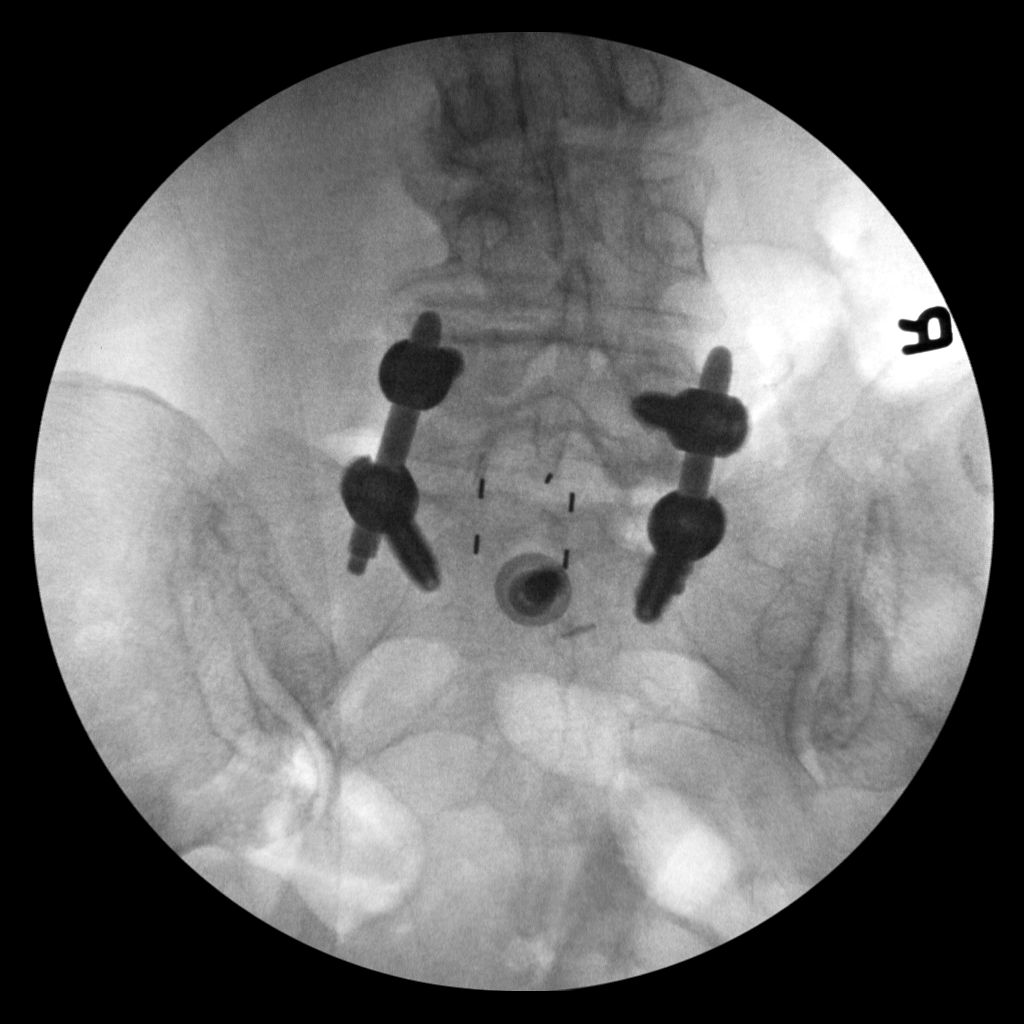

[2 of 2 positions shown; findings below may reference images not displayed]

FINDINGS: Intraoperative images demonstrate posterior pedicle screw and rod
fixation at L5-S1. Disc spacer is in place. Anterior probe is noted.
Second image demonstrates a screw in the anterior sacrum.
IMPRESSION: Intraoperative localization of L5-S1 fusion.
